# Patient Record
Sex: Female | Born: 1970
Health system: Southern US, Community
[De-identification: ages and names within clinical notes are randomized; demographics above are authoritative.]

## PROBLEM LIST (undated history)

## (undated) DIAGNOSIS — F329 Major depressive disorder, single episode, unspecified: Secondary | ICD-10-CM

## (undated) DIAGNOSIS — E079 Disorder of thyroid, unspecified: Secondary | ICD-10-CM

## (undated) DIAGNOSIS — F419 Anxiety disorder, unspecified: Secondary | ICD-10-CM

## (undated) DIAGNOSIS — F32A Depression, unspecified: Secondary | ICD-10-CM

## (undated) DIAGNOSIS — K219 Gastro-esophageal reflux disease without esophagitis: Secondary | ICD-10-CM

## (undated) HISTORY — DX: Anxiety disorder, unspecified: F41.9

## (undated) HISTORY — DX: Disorder of thyroid, unspecified: E07.9

## (undated) HISTORY — PX: CHOLECYSTECTOMY: SHX55

## (undated) HISTORY — DX: Depression, unspecified: F32.A

## (undated) HISTORY — DX: Major depressive disorder, single episode, unspecified: F32.9

## (undated) HISTORY — DX: Gastro-esophageal reflux disease without esophagitis: K21.9

---

## 2001-04-28 ENCOUNTER — Encounter: Payer: Self-pay | Admitting: Family Medicine

## 2001-04-28 ENCOUNTER — Encounter: Admission: RE | Admit: 2001-04-28 | Discharge: 2001-04-28 | Payer: Self-pay | Admitting: Family Medicine

## 2003-01-21 ENCOUNTER — Encounter: Admission: RE | Admit: 2003-01-21 | Discharge: 2003-01-21 | Payer: Self-pay | Admitting: Emergency Medicine

## 2003-03-03 ENCOUNTER — Other Ambulatory Visit: Admission: RE | Admit: 2003-03-03 | Discharge: 2003-03-03 | Payer: Self-pay | Admitting: Obstetrics and Gynecology

## 2003-11-29 ENCOUNTER — Ambulatory Visit: Payer: Self-pay | Admitting: Sports Medicine

## 2004-06-12 ENCOUNTER — Other Ambulatory Visit: Admission: RE | Admit: 2004-06-12 | Discharge: 2004-06-12 | Payer: Self-pay | Admitting: Obstetrics and Gynecology

## 2004-08-17 ENCOUNTER — Emergency Department (HOSPITAL_COMMUNITY): Admission: EM | Admit: 2004-08-17 | Discharge: 2004-08-17 | Payer: Self-pay | Admitting: Emergency Medicine

## 2004-08-20 ENCOUNTER — Encounter: Admission: RE | Admit: 2004-08-20 | Discharge: 2004-08-20 | Payer: Self-pay | Admitting: Emergency Medicine

## 2006-08-19 ENCOUNTER — Other Ambulatory Visit: Admission: RE | Admit: 2006-08-19 | Discharge: 2006-08-19 | Payer: Self-pay | Admitting: Obstetrics and Gynecology

## 2007-03-06 ENCOUNTER — Other Ambulatory Visit: Admission: RE | Admit: 2007-03-06 | Discharge: 2007-03-06 | Payer: Self-pay | Admitting: Obstetrics and Gynecology

## 2007-07-16 ENCOUNTER — Encounter: Admission: RE | Admit: 2007-07-16 | Discharge: 2007-07-16 | Payer: Self-pay | Admitting: Emergency Medicine

## 2007-09-22 ENCOUNTER — Other Ambulatory Visit: Admission: RE | Admit: 2007-09-22 | Discharge: 2007-09-22 | Payer: Self-pay | Admitting: Obstetrics and Gynecology

## 2008-03-21 ENCOUNTER — Other Ambulatory Visit: Admission: RE | Admit: 2008-03-21 | Discharge: 2008-03-21 | Payer: Self-pay | Admitting: Obstetrics and Gynecology

## 2009-03-11 ENCOUNTER — Emergency Department (HOSPITAL_COMMUNITY): Admission: EM | Admit: 2009-03-11 | Discharge: 2009-03-11 | Payer: Self-pay | Admitting: Emergency Medicine

## 2009-05-09 ENCOUNTER — Other Ambulatory Visit: Admission: RE | Admit: 2009-05-09 | Discharge: 2009-05-09 | Payer: Self-pay | Admitting: Obstetrics and Gynecology

## 2009-05-10 ENCOUNTER — Encounter: Admission: RE | Admit: 2009-05-10 | Discharge: 2009-05-10 | Payer: Self-pay | Admitting: Obstetrics and Gynecology

## 2010-03-04 ENCOUNTER — Encounter: Payer: Self-pay | Admitting: Emergency Medicine

## 2010-04-29 LAB — POCT I-STAT, CHEM 8
Calcium, Ion: 1.13 mmol/L (ref 1.12–1.32)
Chloride: 105 mEq/L (ref 96–112)
HCT: 43 % (ref 36.0–46.0)
Hemoglobin: 14.6 g/dL (ref 12.0–15.0)
Potassium: 4.4 mEq/L (ref 3.5–5.1)
TCO2: 29 mmol/L (ref 0–100)

## 2010-06-19 ENCOUNTER — Other Ambulatory Visit: Payer: Self-pay | Admitting: Obstetrics and Gynecology

## 2010-06-19 DIAGNOSIS — Z1231 Encounter for screening mammogram for malignant neoplasm of breast: Secondary | ICD-10-CM

## 2010-06-25 ENCOUNTER — Ambulatory Visit
Admission: RE | Admit: 2010-06-25 | Discharge: 2010-06-25 | Disposition: A | Discharge status: Home / Self Care | Payer: BC Managed Care – PPO | Source: Ambulatory Visit | Attending: Obstetrics and Gynecology | Admitting: Obstetrics and Gynecology

## 2010-06-25 DIAGNOSIS — Z1231 Encounter for screening mammogram for malignant neoplasm of breast: Secondary | ICD-10-CM

## 2010-07-18 ENCOUNTER — Other Ambulatory Visit: Payer: Self-pay | Admitting: Emergency Medicine

## 2010-07-18 DIAGNOSIS — E041 Nontoxic single thyroid nodule: Secondary | ICD-10-CM

## 2010-07-20 ENCOUNTER — Ambulatory Visit
Admission: RE | Admit: 2010-07-20 | Discharge: 2010-07-20 | Disposition: A | Discharge status: Home / Self Care | Payer: BC Managed Care – PPO | Source: Ambulatory Visit | Attending: Emergency Medicine | Admitting: Emergency Medicine

## 2010-07-20 DIAGNOSIS — E041 Nontoxic single thyroid nodule: Secondary | ICD-10-CM

## 2010-10-17 ENCOUNTER — Other Ambulatory Visit: Payer: Self-pay | Admitting: Obstetrics and Gynecology

## 2010-10-17 ENCOUNTER — Other Ambulatory Visit (HOSPITAL_COMMUNITY)
Admission: RE | Admit: 2010-10-17 | Discharge: 2010-10-17 | Disposition: A | Discharge status: Home / Self Care | Payer: BC Managed Care – PPO | Source: Ambulatory Visit | Attending: Obstetrics and Gynecology | Admitting: Obstetrics and Gynecology

## 2010-10-17 DIAGNOSIS — Z01419 Encounter for gynecological examination (general) (routine) without abnormal findings: Secondary | ICD-10-CM | POA: Insufficient documentation

## 2011-05-24 ENCOUNTER — Ambulatory Visit (INDEPENDENT_AMBULATORY_CARE_PROVIDER_SITE_OTHER): Payer: BC Managed Care – PPO | Admitting: Family Medicine

## 2011-05-24 VITALS — BP 124/82 | HR 76 | Temp 98.1°F | Resp 16 | Ht 66.5 in | Wt 152.6 lb

## 2011-05-24 DIAGNOSIS — R002 Palpitations: Secondary | ICD-10-CM

## 2011-05-24 NOTE — Progress Notes (Signed)
41 yo woman with "palpitations" which began in September, but the symptoms eased and now for two months the symptoms are worse.  They typically are worse when lying quietly, and her pulse skips beats.  No F/H of irregular heart beats.   1 drink of coffee.  Some coughing with pollen and home renovation.  PMHx:  GERD, hypothyroidism (years), and mild depression   O:  NAD Neck:  No adenopathy, palpable smooth thyroid Chest:  Clear Heart:  Regular, no murmur or gallop in three different positions Ext:  No edema, good pulses  EKG:  NSR  A:  Palpitations with some perimenopausal sx, mother went through menopause in early 49=0's  P:  Check thyroid, cmet, fsh

## 2011-05-24 NOTE — Patient Instructions (Signed)
Palpitations  A palpitation is the feeling that your heartbeat is irregular or is faster than normal. Although this is frightening, it usually is not serious. Palpitations may be caused by excesses of smoking, caffeine, or alcohol. They are also brought on by stress and anxiety. Sometimes, they are caused by heart disease. Unless otherwise noted, your caregiver did not find any signs of serious illness at this time. HOME CARE INSTRUCTIONS  To help prevent palpitations:  Drink decaffeinated coffee, tea, and soda pop. Avoid chocolate.   If you smoke or drink alcohol, quit or cut down as much as possible.   Reduce your stress or anxiety level. Biofeedback, yoga, or meditation will help you relax. Physical activity such as swimming, jogging, or walking also may be helpful.  SEEK MEDICAL CARE IF:   You continue to have a fast heartbeat.   Your palpitations occur more often.  SEEK IMMEDIATE MEDICAL CARE IF: You develop chest pain, shortness of breath, severe headache, dizziness, or fainting. Document Released: 01/26/2000 Document Revised: 01/17/2011 Document Reviewed: 03/27/2007 [employer] Patient Information 2012 [employer]. 

## 2011-05-25 LAB — TSH: TSH: 3.034 u[IU]/mL (ref 0.350–4.500)

## 2011-05-25 LAB — COMPREHENSIVE METABOLIC PANEL
ALT: 11 U/L (ref 0–35)
AST: 14 U/L (ref 0–37)
Albumin: 4.5 g/dL (ref 3.5–5.2)
Alkaline Phosphatase: 54 U/L (ref 39–117)
BUN: 14 mg/dL (ref 6–23)
CO2: 26 mEq/L (ref 19–32)
Calcium: 9.2 mg/dL (ref 8.4–10.5)
Chloride: 105 mEq/L (ref 96–112)
Creat: 0.76 mg/dL (ref 0.50–1.10)
Glucose, Bld: 88 mg/dL (ref 70–99)
Potassium: 3.8 mEq/L (ref 3.5–5.3)
Sodium: 139 mEq/L (ref 135–145)
Total Bilirubin: 0.3 mg/dL (ref 0.3–1.2)
Total Protein: 7.2 g/dL (ref 6.0–8.3)

## 2011-05-25 LAB — FOLLICLE STIMULATING HORMONE: FSH: 4.6 m[IU]/mL

## 2011-05-25 LAB — T4, FREE: Free T4: 1.3 ng/dL (ref 0.80–1.80)

## 2011-06-11 NOTE — Progress Notes (Signed)
Spoke with patient and let her know that everything was normal.

## 2011-07-28 ENCOUNTER — Other Ambulatory Visit: Payer: Self-pay | Admitting: *Deleted

## 2011-07-28 MED ORDER — LEVOTHYROXINE SODIUM 137 MCG PO TABS: 137.0000 ug | ORAL_TABLET | Freq: Every day | ORAL | Status: DC

## 2011-08-06 ENCOUNTER — Other Ambulatory Visit: Payer: Self-pay | Admitting: Emergency Medicine

## 2011-08-06 DIAGNOSIS — Z1231 Encounter for screening mammogram for malignant neoplasm of breast: Secondary | ICD-10-CM

## 2011-08-07 ENCOUNTER — Ambulatory Visit (INDEPENDENT_AMBULATORY_CARE_PROVIDER_SITE_OTHER): Payer: BC Managed Care – PPO | Admitting: Emergency Medicine

## 2011-08-07 VITALS — BP 130/85 | HR 71 | Temp 98.2°F | Resp 16 | Ht 67.0 in | Wt 148.0 lb

## 2011-08-07 DIAGNOSIS — R21 Rash and other nonspecific skin eruption: Secondary | ICD-10-CM

## 2011-08-07 LAB — POCT CBC
Granulocyte percent: 59.9 %G (ref 37–80)
HCT, POC: 44.5 % (ref 37.7–47.9)
Hemoglobin: 14.5 g/dL (ref 12.2–16.2)
Lymph, poc: 2.3 (ref 0.6–3.4)
MID (cbc): 0.4 (ref 0–0.9)
MPV: 12.7 fL (ref 0–99.8)
POC Granulocyte: 4.1 (ref 2–6.9)
POC LYMPH PERCENT: 34.5 %L (ref 10–50)
POC MID %: 5.6 %M (ref 0–12)
RBC: 4.94 M/uL (ref 4.04–5.48)
WBC: 6.8 10*3/uL (ref 4.6–10.2)

## 2011-08-07 LAB — POCT SEDIMENTATION RATE: POCT SED RATE: 8 mm/h (ref 0–22)

## 2011-08-07 MED ORDER — LEVOTHYROXINE SODIUM 137 MCG PO TABS: 137.0000 ug | ORAL_TABLET | Freq: Every day | ORAL | Status: DC

## 2011-08-07 MED ORDER — SERTRALINE HCL 25 MG PO TABS: 25.0000 mg | ORAL_TABLET | Freq: Every day | ORAL | Status: DC

## 2011-08-07 MED ORDER — PERMETHRIN 5 % EX CREA: TOPICAL_CREAM | CUTANEOUS | Status: DC

## 2011-08-07 NOTE — Progress Notes (Signed)
  Subjective:    Patient ID: Kimberly Mooney, female    DOB: 1970-11-17, 41 y.o.   MRN: [id number]  HPI patient enters after noticing a rash the last 24-48 hours primarily on her abdomen but also in place on the back of her left hand. She does not know of any tick exposures. She has not had a fever. She overall feels well. The areas do not itch. She does have a history of a borderline positive ANA in the past . Recent repeat testing was negative    Review of Systems     Objective:   Physical Exam there are multiple 2 x 3 mm nonblanching lesions across the abdomen and a few on the lower back there no areas on the legs. There is one red area on the back of the finger on her left hand. Her HEENT exam is unremarkable chest clear heart regular rate no murmur  Results for orders placed in visit on 08/07/11  POCT CBC      Component Value Range   WBC 6.8  4.6 - 10.2 K/uL   Lymph, poc 2.3  0.6 - 3.4   POC LYMPH PERCENT 34.5  10 - 50 %L   MID (cbc) 0.4  0 - 0.9   POC MID % 5.6  0 - 12 %M   POC Granulocyte 4.1  2 - 6.9   Granulocyte percent 59.9  37 - 80 %G   RBC 4.94  4.04 - 5.48 M/uL   Hemoglobin 14.5  12.2 - 16.2 g/dL   HCT, POC 28.4  13.2 - 47.9 %   MCV 90.1  80 - 97 fL   MCH, POC 29.4  27 - 31.2 pg   MCHC 32.6  31.8 - 35.4 g/dL   RDW, POC 44.0     Platelet Count, POC 192  142 - 424 K/uL   MPV 12.7  0 - 99.8 fL  POCT SEDIMENTATION RATE      Component Value Range   POCT SED RATE 8  0 - 22 mm/hr        Assessment & Plan:  These areas could be scabies but they do not itch at all. With a normal white count and sedimentation rate of 80 that goes against any type of tic disease especially with the patient not having fever or feeling ill. 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

## 2011-08-07 NOTE — Patient Instructions (Signed)
Vasculitis Vasculitis is when your blood vessels are inflamed. There are many different blood vessels in the body, and vasculitis can affect any of them. This includes large (veins and arteries) and small (capillaries) vessels. With vasculitis,   Blood vessel walls can become thick.   Blood vessels can become narrow.   Blood vessels can become weak. Sometimes, it becomes so weak that the blood vessel bulges out like a balloon. This is called an aneurysm. Aneurysms are rare but can be life-threatening.   Scarring can occur.   Not enough blood can flow through the blood vessels.  All of these things can damage many parts of the body, including the muscles, kidneys, lungs and brain. There are many types of vasculitis. Some types are short-term (acute), while others are long-term (chronic). Some types may go away without treatment, and others may need to be treated for a long time.  CAUSES  Vasculitis occurs when the body's immune system (which fights germs and disease) makes a mistake. It attacks its own blood vessels. This causes inflammation (the body's way of reacting to injury or infection).   Why this happens is usually not known. The condition is then called primary vasculitis.   Sometimes, something triggers the inflammation. This is called secondary vasculitis. Possible causes include:   Infections.   An immune system disease. Examples include lupus, rheumatoid arthritis and scleroderma.   An allergic reaction to a medicine.   Cancer that affects blood cells. This includes leukemia and lymphoma.   Males and females of all ages and races can develop vasculitis. Some risk factors make vasculitis more likely. These include:   Smoking.   Stress.   Physical injury.  SYMPTOMS  There are more than 20 types of vasculitis. Symptoms of each type vary, but some symptoms are common.  Many people with vasculitis:   Have a fever.   Do not feel like eating.   Lose weight.   Feel  very tired.   Have aches and pains.   Feel weak.   Start to not have feeling (numbness) in an area.   Symptoms for some types of vasculitis also could be:   Sores in the mouth or eyes.   Skin problems. This could be sores, spots or rashes.   Trouble seeing.   Trouble breathing.   Blood in the urine.   Headaches.   Pain in the abdomen.   Stuffy or bloody nose.  DIAGNOSIS  Vasculitis symptoms are similar to symptoms of many other conditions. That can make it hard to tell if you have vasculitis. To be sure, your caregiver will ask about your symptoms and do a physical exam. Certain tests may be necessary, such as:   A complete blood count (CBC). This test shows how many red blood cells are in your blood. Not having enough red blood cells (anemic) can result from vasculitis.   Erythrocyte sedimentation (also called sed rate test). It measures inflammation in the body.   C reactive protein (CRP). This also shows if there is inflammation.   Anti-neutrophil cytoplasmic antibodies (ANCA). This can tell if the immune system is reacting to certain cells in the blood.   A urine test. This checks for blood or protein in the urine. That could be a sign of kidney damage from vasculitis.   Imaging tests. These tests create pictures from inside the body. Options include:   X-rays.   Computed tomography (CT) scan. This uses X-rays guided by a computer.   Ultrasound. It   creates an image using sound waves.   Magnetic resonance imaging (MRI). It uses radio waves, magnets and a computer.   Angiography. A dye is put into your blood vessels. Then, an X-ray is taken of them.   A biopsy of a blood vessel. This means your caregiver will take out a small piece of a blood vessel. Then, it is checked under a microscope. This is an important test. It often is the best way to know for sure if you have vasculitis.  TREATMENT   Treatment will depend on the type of vasculitis and how severe it is.  Often, you will need to see a specialist in immunologic diseases (rheumatologist).   Some types of vasculitis may go away without treatment.   Some types need only over-the-counter drugs.   Prescription medicines are used to treat many types of vasculitis. For example:   Corticosteroids. These are the drugs used most often. They are very powerful. Usually, a high dose is taken until symptoms improve. Then, the dose is gradually decreased. Using corticosteroids for a long time can cause problems. They can make muscles and bones weak. They can cause blood pressure to go up, and cause diabetes. Also, people often gain weight when they take corticosteroids.   Cytotoxic drugs. These kill cells that cause inflammation. Sometimes, they are used if corticosteroids do not help. Other times, both medications are taken.   Surgery. This may be needed to repair a blood vessel that has bulged out (aneurysm).   Treatment can sometimes cure your disease. Other times, it can put the disease in remission (no symptoms). Increased treatment and reevaluation might be necessary if your disease comes back or flares.  HOME CARE INSTRUCTIONS   Take any medications that your caregiver prescribes. Follow the directions carefully.   Watch for any problems that can be caused by a drug (side effects). Tell your caregiver right away if you notice any changes or problems.   Keep all appointments for checkups. This is important to help your caregiver watch for side effects. Checkups may include:   Periodic blood tests.   Bone density testing. This checks how strong or weak your bones are.   Blood pressure checks. If your blood pressure rises, you may need to take a drug to control it while you are taking corticosteroids.   Blood sugar checks. This is to be sure you are not developing diabetes. If you have diabetes, corticosteroid medications may make it worse and require increased treatment.   Exercise. First, talk  with your caregiver about what would be OK for you to do. Aerobic exercise (which increases your heart rate) is often suggested. It includes walking. This type of exercise is good because it helps prevent bone loss. It also helps control your blood pressure.   Follow a healthy diet. Include good sources of protein in your diet. Also include fruits, vegetables and whole grains. Your caregiver can refer you to an expert on healthy eating (dietitian) for more detailed advice.   Learn as much as you can about vasculitis. Understanding your condition can help you cope with it. Coping can be hard because this may be something you will have to live with for years.   Consider joining a support group. It often helps to talk about your worries with others who have the same problems.   Tell your caregiver if you feel stressed, anxious or depressed. Your caregiver may refer you to a specialist, or recommend medication to relieve your symptoms.    SEEK MEDICAL CARE IF:   The symptoms that led to your diagnosis return.   You develop worsening fever, fatigue, headache, weight loss or pain in your jaw.   You develop signs of infection. Infections can be worse if you are on corticosteroid medication.   You develop any new or unexplained symptoms of disease.  SEEK IMMEDIATE MEDICAL CARE IF:   Your eyesight changes.   Pain does not go away, even after taking medication.   You feel pain in your chest or abdomen.   You have trouble breathing.   One side of your face or body becomes suddenly weak or numb.   Your nose bleeds.   There is blood in your urine.   You develop a fever of more than 102 F (38.9 C).  Document Released: 11/24/2008 Document Revised: 01/17/2011 Document Reviewed: 11/24/2008 [facility] Patient Information 2012 [place], [place] Spotted Fever [place] Spotted Fever (RMSF) is the oldest known tick-borne disease of people in the [place]. This disease was named  because it was first described among people in the [facility] area who had an illness characterized by a rash with red-purple-black spots. This disease is caused by a rickettsia (Rickettsia rickettsii), a bacteria carried by the tick. The [facility] wood tick and the American dog tick, acquire and transmit the RMSF bacteria (pictures NOT actual size). When a larval, nymphal or adult tick feeds on an infected rodent or larger animal, the tick can become infected. Infected adult ticks then feed on people who may then get RMSF. The tick transmits the disease to humans during a prolonged period of feeding that lasts many hours, days or even a couple weeks. The bite is painless and frequently goes unnoticed. An infected female tick may also pass the rickettsial bacteria to her eggs that then may mature to be infected adult ticks. The rickettsia that causes RMSF can also get into a person's body through damaged skin. A tick bite is not necessary. People can get RMSF if they crush a tick and get it's blood or body fluids on their skin through a small cut or sore.  DIAGNOSIS Diagnosis is made by laboratory tests.  TREATMENT Treatment is with antibiotics (medications that kill rickettsia and other bacteria). Immediate treatment usually prevents death. GEOGRAPHIC RANGE This disease was reported only in the [facility] until 1931. RMSF has more recently been described among individuals in all states except [place], [employer] and [place]. The highest reported incidences of RMSF now occur among residents of [place], [place], [place] and [address]. TIME OF YEAR  Most cases are diagnosed during late spring and summer when ticks are most active. However, especially in the warmer [place] states, a few cases occur during the winter. SYMPTOMS   Symptoms of RMSF begin from 2 to 14 days after a tick bite. The most common early symptoms are fever, muscle aches and headache followed by nausea (feeling sick to  your stomach) or vomiting.   The RMSF rash is typically delayed until 3 or more days after symptom onset, and eventually develops in 9 of 10 infected patients by the 5th day of illness.  If the disease is not treated it can cause death. If you get a fever, headache, muscle aches, rash, nausea or vomiting within 2 weeks of a possible tick bite or exposure you should see your caregiver immediately. PREVENTION Ticks prefer to hide in shady, moist ground litter. They can often be found above the ground clinging to tall grass, brush,  shrubs and low tree branches. They also inhabit lawns and gardens, especially at the edges of woodlands and around old stone walls. Within the areas where ticks generally live, no naturally vegetated area can be considered completely free of infected ticks. The best precaution against RMSF is to avoid contact with soil, leaf litter and vegetation as much as possible in tick infested areas. For those who enjoy gardening or walking in their yards, clear brush and mow tall grass around houses and at the edges of gardens. This may help reduce the tick population in the immediate area. Applications of chemical insecticides by a licensed professional in the spring (late May) and Fall (September) will also control ticks, especially in heavily infested areas. Treatment will never get rid of all the ticks. Getting rid of small animal populations that host ticks will also decrease the tick population. When working in the garden, [employer], or handling soil and vegetation, wear light-colored protective clothing and gloves. Spot-check often to prevent ticks from reaching the skin. Ticks cannot jump or fly. They will not drop from an above-ground perch onto a passing animal. Once a tick gains access to human skin it climbs upward until it reaches a more protected area. For example, the back of the knee, groin, navel, armpit, ears or nape of the neck. It then begins the slow process of embedding  itself in the skin. Campers, hikers, field workers, and others who spend time in wooded, brushy or tall grassy areas can avoid exposure to ticks by using the following precautions:  Wear light-colored clothing with a tight weave to spot ticks more easily and prevent contact with the skin.   Wear long pants tucked into socks, long-sleeved shirts tucked into pants and enclosed shoes or boots along with insect repellent.   Spray clothes with insect repellent containing either DEET or Permethrin. Only DEET can be used on exposed skin. Follow the manufacturer's directions carefully.   Wear a hat and keep long hair pulled back.   Stay on cleared, well-worn trails whenever possible.   Spot-check yourself and others often for the presence of ticks on clothes. If you find one, there are likely to be others. Check thoroughly.   Remove clothes after leaving tick-infested areas. If possible, wash them to eliminate any unseen ticks. Check yourself, your children and any pets from head to toe for the presence of ticks.   Shower and shampoo.  You can greatly reduce your chances of contracting RMSF if you remove attached ticks as soon as possible. Regular checks of the body, including all body sites covered by hair (head, armpits, genitals), allow removal of the tick before rickettsial transmission. To remove an attached tick, use a forceps or tweezers to detach the intact tick without leaving mouth parts in the skin. The tick bite wound should be cleansed after tick removal. Remember the most common symptoms of RMSF are fever, muscle aches, headache and nausea or vomiting with a later onset of rash. If you get these symptoms after a tick bite and while living in an area where RMSF is found, RMSF should be suspected. If the disease is not treated, it can cause death. See your caregiver immediately if you get these symptoms. Do this even if not aware of a tick bite. Document Released: 05/12/2000 Document Revised:  01/17/2011 Document Reviewed: 01/02/2009 [facility] Patient Information 2012 [place], [place].

## 2011-08-08 ENCOUNTER — Ambulatory Visit: Payer: BC Managed Care – PPO

## 2011-08-08 LAB — B. BURGDORFI ANTIBODIES: B burgdorferi Ab IgG+IgM: 0.78 {ISR}

## 2011-08-08 LAB — ROCKY MTN SPOTTED FVR AB, IGM-BLOOD: ROCKY MTN SPOTTED FEVER, IGM: 0.4 IV

## 2011-08-13 ENCOUNTER — Telehealth: Payer: Self-pay

## 2011-08-13 ENCOUNTER — Other Ambulatory Visit: Payer: Self-pay | Admitting: Emergency Medicine

## 2011-08-13 NOTE — Telephone Encounter (Signed)
Pt was seen on 06/26 and was given Elimite cream.  She used it and was itching by the end of the weekend. She would rather have a round of prednisone if she can.  If not can she get some more cream?

## 2011-08-13 NOTE — Telephone Encounter (Signed)
PT REQUESTING PREDNISONE RX.   BEST PHONE  [phone]   PHARMACY [facility]

## 2011-08-13 NOTE — Telephone Encounter (Signed)
lmom to cb.  Make sure this is correct med.  If so then pt needs to be seen.

## 2011-08-15 ENCOUNTER — Telehealth: Payer: Self-pay

## 2011-08-15 NOTE — Telephone Encounter (Signed)
Patient was seen recently by Dr Cleta Alberts for a rash. Called 2 days ago about getting prednisone called in and has not received call back. Still has the rash and still itching. Uses [facility]. Best call back # [phone].

## 2011-08-15 NOTE — Telephone Encounter (Signed)
Pt stated when she called that she had called a couple of days ago, but we do not see any record of the call. Pt asked operator when she called about getting prednisone for the itching that she is having. Dr Cleta Alberts, do you want to Rx?

## 2011-08-15 NOTE — Telephone Encounter (Signed)
OK'd call-in prednisone Dosepak 10 mg 6 by mouth for one day 5 by mouth for one day for by mouth for one day 3 by mouth for one day 2 by mouth for one day one a day for one day.This is prednisone 10 mg.

## 2011-08-15 NOTE — Telephone Encounter (Signed)
Called in Rx for pred to [facility] and notified pt and explained how to take the tapered doses.

## 2011-08-15 NOTE — Telephone Encounter (Signed)
Refill already addressed.

## 2011-08-23 ENCOUNTER — Other Ambulatory Visit: Payer: Self-pay

## 2011-08-23 MED ORDER — LEVOTHYROXINE SODIUM 137 MCG PO TABS: 137.0000 ug | ORAL_TABLET | Freq: Every day | ORAL | Status: DC

## 2011-09-01 ENCOUNTER — Ambulatory Visit (INDEPENDENT_AMBULATORY_CARE_PROVIDER_SITE_OTHER): Payer: BC Managed Care – PPO | Admitting: Family Medicine

## 2011-09-01 VITALS — BP 129/78 | HR 74 | Temp 98.0°F | Resp 16 | Ht 66.5 in | Wt 150.0 lb

## 2011-09-01 DIAGNOSIS — R21 Rash and other nonspecific skin eruption: Secondary | ICD-10-CM

## 2011-09-01 MED ORDER — HYDROXYZINE HCL 10 MG PO TABS: 10.0000 mg | ORAL_TABLET | Freq: Three times a day (TID) | ORAL | Status: AC | PRN

## 2011-09-01 MED ORDER — IVERMECTIN 3 MG PO TABS: ORAL_TABLET | ORAL | Status: DC

## 2011-09-01 NOTE — Patient Instructions (Signed)
Results for orders placed in visit on 08/07/11  POCT CBC      Component Value Range   WBC 6.8  4.6 - 10.2 K/uL   Lymph, poc 2.3  0.6 - 3.4   POC LYMPH PERCENT 34.5  10 - 50 %L   MID (cbc) 0.4  0 - 0.9   POC MID % 5.6  0 - 12 %M   POC Granulocyte 4.1  2 - 6.9   Granulocyte percent 59.9  37 - 80 %G   RBC 4.94  4.04 - 5.48 M/uL   Hemoglobin 14.5  12.2 - 16.2 g/dL   HCT, POC 01.0  93.2 - 47.9 %   MCV 90.1  80 - 97 fL   MCH, POC 29.4  27 - 31.2 pg   MCHC 32.6  31.8 - 35.4 g/dL   RDW, POC 35.5     Platelet Count, POC 192  142 - 424 K/uL   MPV 12.7  0 - 99.8 fL  ROCKY MTN SPOTTED FVR AB, IGM-BLOOD      Component Value Range   ROCKY MTN SPOTTED FEVER, IGM 0.40    B. BURGDORFI ANTIBODIES      Component Value Range   B burgdorferi Ab IgG+IgM 0.78    POCT SEDIMENTATION RATE      Component Value Range   POCT SED RATE 8  0 - 22 mm/hr

## 2011-09-01 NOTE — Progress Notes (Signed)
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  She was initially seen by Dr. Cleta Alberts who felt patient's rash was most consistent with scabies and she was treated with permethrin and prednisone.  The rash slowly resolved over three weeks but has returned over the last few days.  The rash has affected both axillae, breasts, abdomen, and groin areas including proximal thighs.  Her family has no rashes.  The rash spares her hands and forearms.  One papule on her right thigh has a small, painful ulceration.   O:  NAD Rash:  Scattered intensely red papules axillae, abdomen, groin, lower abdomen and proximal medial thighs.  They are uniform in size and evenly distributed about 2 cm apart.  The right medial thigh lesion is 2 mm and ulcerated   Results for orders placed in visit on 08/07/11  POCT CBC      Component Value Range   WBC 6.8  4.6 - 10.2 K/uL   Lymph, poc 2.3  0.6 - 3.4   POC LYMPH PERCENT 34.5  10 - 50 %L   MID (cbc) 0.4  0 - 0.9   POC MID % 5.6  0 - 12 %M   POC Granulocyte 4.1  2 - 6.9   Granulocyte percent 59.9  37 - 80 %G   RBC 4.94  4.04 - 5.48 M/uL   Hemoglobin 14.5  12.2 - 16.2 g/dL   HCT, POC 40.9  81.1 - 47.9 %   MCV 90.1  80 - 97 fL   MCH, POC 29.4  27 - 31.2 pg   MCHC 32.6  31.8 - 35.4 g/dL   RDW, POC 91.4     Platelet Count, POC 192  142 - 424 K/uL   MPV 12.7  0 - 99.8 fL  ROCKY MTN SPOTTED FVR AB, IGM-BLOOD      Component Value Range   ROCKY MTN SPOTTED FEVER, IGM 0.40    B. BURGDORFI ANTIBODIES      Component Value Range   B burgdorferi Ab IgG+IgM 0.78    POCT SEDIMENTATION RATE      Component Value Range   POCT SED RATE 8  0 - 22 mm/hr   A:  Atypical rash, pruritic but with odd distribution and ulceration.  The sparing of the fingers and forearms makes scabies much less likely.  P:   1. Rash  ivermectin (STROMECTOL) 3 MG TABS, hydrOXYzine (ATARAX/VISTARIL) 10 MG tablet, Ambulatory referral to Dermatology

## 2011-09-05 ENCOUNTER — Other Ambulatory Visit: Payer: Self-pay

## 2011-10-31 ENCOUNTER — Ambulatory Visit: Payer: BC Managed Care – PPO

## 2011-11-04 ENCOUNTER — Ambulatory Visit
Admission: RE | Admit: 2011-11-04 | Discharge: 2011-11-04 | Disposition: A | Discharge status: Home / Self Care | Payer: BC Managed Care – PPO | Source: Ambulatory Visit | Attending: Emergency Medicine | Admitting: Emergency Medicine

## 2011-11-04 DIAGNOSIS — Z1231 Encounter for screening mammogram for malignant neoplasm of breast: Secondary | ICD-10-CM

## 2011-12-10 ENCOUNTER — Ambulatory Visit: Payer: BC Managed Care – PPO

## 2011-12-10 ENCOUNTER — Encounter: Payer: Self-pay | Admitting: Emergency Medicine

## 2011-12-10 ENCOUNTER — Ambulatory Visit (INDEPENDENT_AMBULATORY_CARE_PROVIDER_SITE_OTHER): Payer: BC Managed Care – PPO | Admitting: Emergency Medicine

## 2011-12-10 ENCOUNTER — Other Ambulatory Visit: Payer: Self-pay | Admitting: Emergency Medicine

## 2011-12-10 VITALS — BP 130/82 | HR 73 | Temp 98.2°F | Resp 16 | Ht 66.5 in | Wt 148.6 lb

## 2011-12-10 DIAGNOSIS — M25559 Pain in unspecified hip: Secondary | ICD-10-CM

## 2011-12-10 DIAGNOSIS — R1084 Generalized abdominal pain: Secondary | ICD-10-CM

## 2011-12-10 DIAGNOSIS — Z139 Encounter for screening, unspecified: Secondary | ICD-10-CM

## 2011-12-10 DIAGNOSIS — Z202 Contact with and (suspected) exposure to infections with a predominantly sexual mode of transmission: Secondary | ICD-10-CM

## 2011-12-10 DIAGNOSIS — L410 Pityriasis lichenoides et varioliformis acuta: Secondary | ICD-10-CM | POA: Insufficient documentation

## 2011-12-10 DIAGNOSIS — Z23 Encounter for immunization: Secondary | ICD-10-CM

## 2011-12-10 LAB — COMPREHENSIVE METABOLIC PANEL
ALT: 11 U/L (ref 0–35)
CO2: 26 mEq/L (ref 19–32)
Calcium: 9.6 mg/dL (ref 8.4–10.5)
Chloride: 102 mEq/L (ref 96–112)
Sodium: 135 mEq/L (ref 135–145)
Total Protein: 7.1 g/dL (ref 6.0–8.3)

## 2011-12-10 LAB — CBC WITH DIFFERENTIAL/PLATELET
HCT: 41 % (ref 36.0–46.0)
Hemoglobin: 14.1 g/dL (ref 12.0–15.0)
Lymphocytes Relative: 27 % (ref 12–46)
Lymphs Abs: 1.9 10*3/uL (ref 0.7–4.0)
MCHC: 34.4 g/dL (ref 30.0–36.0)
Monocytes Absolute: 0.5 10*3/uL (ref 0.1–1.0)
Monocytes Relative: 7 % (ref 3–12)
Neutro Abs: 4.6 10*3/uL (ref 1.7–7.7)
RBC: 4.79 MIL/uL (ref 3.87–5.11)
WBC: 7.1 10*3/uL (ref 4.0–10.5)

## 2011-12-10 LAB — POCT SEDIMENTATION RATE: POCT SED RATE: 7 mm/hr (ref 0–22)

## 2011-12-10 LAB — HIV ANTIBODY (ROUTINE TESTING W REFLEX): HIV: NONREACTIVE

## 2011-12-10 NOTE — Patient Instructions (Signed)
Please make an appointment to see her dermatologist to discuss the issue here having with itching

## 2011-12-10 NOTE — Progress Notes (Signed)
  Subjective:    Patient ID: Kimberly Mooney, female    DOB: August 08, 1970, 41 y.o.   MRN: [id number]  HPI Kimberly Mooney comes in with a chief complaint today of itching. She was diagnosed withPleva by biopsy but only took a short course of steroids. She feels a crawling sensation in her skin. She has tried Zyrtec and try Claritin without any improvement. She does take some supplements but has tried stopping these intermittently but continues to have the itching. She also has had some postprandial back and abdominal discomfort and sheets a fatty meal. She is concerned about gallbladder disease. 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    Review of Systems     Objective:   Physical Exam patient is alert cooperative in no distress. Her neck is supple there is a birthmark on the right side of her neck. Her chest is clear to auscultation and percussion. Her cardiac exam reveals a regular rate without murmurs rubs or gallops. The abdomen is soft liver and spleen not enlarged. There is no axillary or inguinal or cervical adenopathy noted.  [facility] reading (PRIMARY) by  Dr Cleta Alberts x-rays of the chest and both hips are norma      Assessment & Plan:     Give a trial of Allegra 1 a day.Maryclare Labrador schedule ultrasound of abdomen to rule out gallbladder disease. I recommended she make an appointment to see the dermatologist for their opinion. Appointment to be made with Dr. Hortense Ramal

## 2011-12-12 ENCOUNTER — Telehealth: Payer: Self-pay

## 2011-12-12 ENCOUNTER — Encounter: Payer: Self-pay | Admitting: *Deleted

## 2011-12-12 NOTE — Telephone Encounter (Signed)
Please call [facility] and add on Hep Screening Panel

## 2011-12-12 NOTE — Telephone Encounter (Signed)
Patient saw a specialist today and they wanted to know if we could add testing for Hep B, Hep C to the lab work that was done on Tuesday.  [phone]

## 2011-12-13 ENCOUNTER — Ambulatory Visit
Admission: RE | Admit: 2011-12-13 | Discharge: 2011-12-13 | Disposition: A | Discharge status: Home / Self Care | Payer: BC Managed Care – PPO | Source: Ambulatory Visit | Attending: Emergency Medicine | Admitting: Emergency Medicine

## 2011-12-13 DIAGNOSIS — R1084 Generalized abdominal pain: Secondary | ICD-10-CM

## 2011-12-13 LAB — HEPATITIS B SURFACE ANTIBODY, QUANTITATIVE: Hepatitis B-Post: 0 m[IU]/mL

## 2011-12-13 NOTE — Telephone Encounter (Signed)
Added Hep screening panel at [facility] spoke with Selena Batten.

## 2011-12-16 ENCOUNTER — Telehealth: Payer: Self-pay

## 2011-12-16 NOTE — Telephone Encounter (Signed)
The patient called to request that a lab tech return her call. The patient stated she is "playing phone tag" with our office.  Please call the patient at [phone].

## 2011-12-17 ENCOUNTER — Other Ambulatory Visit: Payer: Self-pay | Admitting: Emergency Medicine

## 2011-12-17 ENCOUNTER — Telehealth: Payer: Self-pay | Admitting: Radiology

## 2011-12-17 DIAGNOSIS — K8020 Calculus of gallbladder without cholecystitis without obstruction: Secondary | ICD-10-CM

## 2011-12-17 NOTE — Telephone Encounter (Signed)
LMOM that normal labs and xray were mailed to her on 12-12-11.

## 2011-12-17 NOTE — Telephone Encounter (Signed)
Went over results with pt and would like for [place] to set up referral for general surgeon.

## 2011-12-30 ENCOUNTER — Ambulatory Visit (INDEPENDENT_AMBULATORY_CARE_PROVIDER_SITE_OTHER): Payer: BC Managed Care – PPO | Admitting: Surgery

## 2011-12-30 ENCOUNTER — Encounter (INDEPENDENT_AMBULATORY_CARE_PROVIDER_SITE_OTHER): Payer: Self-pay | Admitting: Surgery

## 2011-12-30 VITALS — BP 112/76 | HR 68 | Temp 98.4°F | Resp 12 | Ht 67.0 in | Wt 147.0 lb

## 2011-12-30 DIAGNOSIS — K8010 Calculus of gallbladder with chronic cholecystitis without obstruction: Secondary | ICD-10-CM

## 2011-12-30 NOTE — Patient Instructions (Addendum)
See the Handout(s) we gave you.  Consider surgery.  Please call our office at [phone] if you wish to schedule surgery or if you have further questions / concerns.   Cholelithiasis Cholelithiasis (also called gallstones) is a form of gallbladder disease where gallstones form in your gallbladder. The gallbladder is a non-essential organ that stores bile made in the liver, which helps digest fats. Gallstones begin as small crystals and slowly grow into stones. Gallstone pain occurs when the gallbladder spasms, and a gallstone is blocking the duct. Pain can also occur when a stone passes out of the duct.  Women are more likely to develop gallstones than men. Other factors that increase the risk of gallbladder disease are:  Having multiple pregnancies. Physicians sometimes advise removing diseased gallbladders before future pregnancies.  Obesity.  Diets heavy in fried foods and fat.  Increasing age (older than 60).  Prolonged use of medications containing female hormones.  Diabetes mellitus.  Rapid weight loss.  Family history of gallstones (heredity). SYMPTOMS  Feeling sick to your stomach (nauseous).  Abdominal pain.  Yellowing of the skin (jaundice).  Sudden pain. It may persist from several minutes to several hours.  Worsening pain with deep breathing or when jarred.  Fever.  Tenderness to the touch. In some cases, when gallstones do not move into the bile duct, people have no pain or symptoms. These are called "silent" gallstones. TREATMENT In severe cases, emergency surgery may be required. HOME CARE INSTRUCTIONS   Only take over-the-counter or prescription medicines for pain, discomfort, or fever as directed by your caregiver.  Follow a low-fat diet until seen again. Fat causes the gallbladder to contract, which can result in pain.  Follow up as instructed. Attacks are almost always recurrent and surgery is usually required for permanent treatment. SEEK  IMMEDIATE MEDICAL CARE IF:   Your pain increases and is not controlled by medications.  You have an oral temperature above 102 F (38.9 C), not controlled by medication.  You develop nausea and vomiting. MAKE SURE YOU:   Understand these instructions.  Will watch your condition.  Will get help right away if you are not doing well or get worse. Document Released: 01/24/2005 Document Revised: 04/22/2011 Document Reviewed: 03/29/2010 Exit[employer] Patient Information 2013 [employer].  

## 2011-12-30 NOTE — Progress Notes (Signed)
Subjective:     Patient ID: Kimberly Mooney, female   DOB: 04/12/1970, 41 y.o.   MRN: [id number]  HPI  Kimberly Mooney  03/07/1970 [id number]  Patient Care Team: Collene Gobble, MD as PCP - General (Family Medicine)  This patient is a 41 y.o.female who presents today for surgical evaluation at the request of Dr. Cleta Alberts.   Reason for evaluation: Abdominal pain.  Probable biliary colic.  Pleasant active woman.  Works as a Engineer, civil (consulting) at [employer].  Has had three episodes of severe upper abdominal/chest pain.  Waking her up at night.  Two episodes were after eating [place] food.  Another one was after eating a funnel cake.  Felt uncomfortable and bloated.  No severe nausea or vomiting.  Radiated to her back.  No one else was sick.  She has a history of chronic reflux.  Followed by Dr. Bosie Clos.  Had endoscopy last year that was otherwise underwhelming.  Well controlled on Pepcid twice a day.  Normally has a bowel movement every other day.  Can walk several miles without difficulty.  No family history of bowel problems.  Occasionally has a glass of wine at night.  No difficulty with tolerating that.  No episodes of hepatitis nor pancreatitis.  No personal nor family history of GI/colon cancer, inflammatory bowel disease, irritable bowel syndrome, allergy such as Celiac Sprue, dietary/dairy problems, colitis, ulcers nor gastritis.  No recent sick contacts/gastroenteritis.  No travel outside the country.  No changes in diet.  She has had some episodes of itching.  No jaundice.  No dark-colored urine.  No light stools.  No change in eye/skin color.    Patient Active Problem List  Diagnosis  . PLEVA (pityriasis lichenoides et varioliformis acuta)  . Chronic cholecystitis with calculus    Past Medical History  Diagnosis Date  . GERD (gastroesophageal reflux disease)   . Thyroid disease     hypothyroidism  . Gallstones     History reviewed. No pertinent past surgical history.  History   Social  History  . Marital Status: Single    Spouse Name: N/A    Number of Children: N/A  . Years of Education: N/A   Occupational History  . Not on file.   Social History Main Topics  . Smoking status: Former Games developer  . Smokeless tobacco: Not on file     Comment: smoked occasionally in college  . Alcohol Use: Yes     Comment: social  . Drug Use: No  . Sexually Active: Not on file   Other Topics Concern  . Not on file   Social History Narrative  . No narrative on file    Family History  Problem Relation Age of Onset  . Cancer Mother     breast    Current Outpatient Prescriptions  Medication Sig Dispense Refill  . B Complex Vitamins (VITAMIN B COMPLEX PO) Take by mouth daily.      . famotidine (PEPCID) 20 MG tablet Take 20 mg by mouth 2 (two) times daily.      . fexofenadine (ALLEGRA) 60 MG tablet Take 60 mg by mouth 2 (two) times daily.      Marland Kitchen ibuprofen (ADVIL,MOTRIN) 200 MG tablet Take 200 mg by mouth every 6 (six) hours as needed.      Marland Kitchen levonorgestrel (MIRENA) 20 MCG/24HR IUD 1 each by Intrauterine route once.      Marland Kitchen levothyroxine (SYNTHROID, LEVOTHROID) 137 MCG tablet Take 1 tablet (137 mcg total) by mouth daily.  30 tablet  11  . Probiotic Product (PROBIOTIC PO) Take by mouth daily.      . sertraline (ZOLOFT) 25 MG tablet Take 1 tablet (25 mg total) by mouth daily.  90 tablet  3     Allergies  Allergen Reactions  . Amoxicillin Diarrhea  . Augmentin (Amoxicillin-Pot Clavulanate) Diarrhea  . Doxycycline   . Shellfish Allergy     BP 112/76  Pulse 68  Temp 98.4 F (36.9 C) (Temporal)  Resp 12  Ht 5\' 7"  (1.702 m)  Wt 147 lb (66.679 kg)  BMI 23.02 kg/m2  LMP 12/01/2011  Dg Chest 2 View  12/10/2011  *RADIOLOGY REPORT*  Clinical Data: Screening for lymphoma.  CHEST - 2 VIEW  Comparison: None  Findings: Midline trachea.  Normal heart size and mediastinal contours. No pleural effusion or pneumothorax.  Minimal biapical pleural parenchymal scarring. Clear lungs.   IMPRESSION: No acute process or evidence of lymphoma.  No evidence of adenopathy in the chest.   Original Report Authenticated By: Consuello Bossier, M.D.    Dg Hip Complete Left  12/10/2011  *RADIOLOGY REPORT*  Clinical Data: Hip pain. No trauma history submitted.  LEFT HIP - COMPLETE 2+ VIEW  Comparison: None.  Findings: AP and frog-leg views. No acute fracture or dislocation. Joint spaces maintained.  No evidence of avascular necrosis.  IMPRESSION: Normal left hip.   Original Report Authenticated By: Consuello Bossier, M.D.    Dg Hip Complete Right  12/10/2011  *RADIOLOGY REPORT*  Clinical Data: Hip pain. No trauma history submitted.  RIGHT HIP - COMPLETE 2+ VIEW  Comparison: None.  Findings: AP view of the pelvis and frog-leg view the right hip. Femoral heads are located.  Intrauterine device. Sacroiliac joints are symmetric.  No acute fracture.  Joint spaces maintained.  IMPRESSION: No acute osseous abnormality.   Original Report Authenticated By: Consuello Bossier, M.D.    US Abdomen Complete  12/13/2011  *RADIOLOGY REPORT*  Clinical Data:  Abdominal pain  COMPLETE ABDOMINAL ULTRASOUND  Comparison:  None.  Findings:  Gallbladder:  The gallbladder is visualized and multiple gallstones are noted within the gallbladder of 8 mm or less in diameter.  No pain is present over the gallbladder with compression.  Common bile duct:  The common bile duct is normal measuring 1.7 mm in diameter.  Liver:  It the liver has a normal echogenic pattern.  No ductal dilatation is seen.  IVC:  Appears normal.  Pancreas:  No focal abnormality seen.  Spleen:  The spleen is normal measuring 7.4 cm sagittally.  Right Kidney:  No hydronephrosis is seen.  The right kidney measures 11.1 cm sagittally.  Left Kidney:  No hydronephrosis is noted.  The left kidney measures 11.6 cm.  Abdominal aorta:  The abdominal aorta is normal in caliber.  IMPRESSION:  1.  Multiple gallstones.  No pain over the gallbladder. 2.  No ductal dilatation.    Original Report Authenticated By: Dwyane Dee, M.D.       Review of Systems  Constitutional: Negative for fever, chills, diaphoresis, appetite change and fatigue.  HENT: Negative for ear pain, sore throat, trouble swallowing, neck pain and ear discharge.   Eyes: Negative for photophobia, discharge and visual disturbance.  Respiratory: Negative for cough, choking, chest tightness and shortness of breath.   Cardiovascular: Negative for chest pain and palpitations.  Gastrointestinal: Positive for abdominal pain. Negative for nausea, vomiting, diarrhea, blood in stool, anal bleeding and rectal pain.  Genitourinary: Negative for dysuria, frequency  and difficulty urinating.  Musculoskeletal: Positive for back pain. Negative for myalgias and gait problem.  Skin: Negative for color change, pallor, rash and wound.  Neurological: Negative for dizziness, speech difficulty, weakness and numbness.  Hematological: Negative for adenopathy.  Psychiatric/Behavioral: Negative for confusion and agitation. The patient is not nervous/anxious.        Objective:   Physical Exam  Constitutional: She is oriented to person, place, and time. She appears well-developed and well-nourished. No distress.  HENT:  Head: Normocephalic.  Mouth/Throat: Oropharynx is clear and moist. No oropharyngeal exudate.  Eyes: Conjunctivae normal and EOM are normal. Pupils are equal, round, and reactive to light. No scleral icterus.  Neck: Normal range of motion. Neck supple. No tracheal deviation present.  Cardiovascular: Normal rate, regular rhythm and intact distal pulses.   Pulmonary/Chest: Effort normal and breath sounds normal. No respiratory distress. She exhibits no tenderness.  Abdominal: Soft. She exhibits no distension and no mass. There is no tenderness. Hernia confirmed negative in the right inguinal area and confirmed negative in the left inguinal area.  Genitourinary: No vaginal discharge found.  Musculoskeletal:  Normal range of motion. She exhibits no tenderness.  Lymphadenopathy:    She has no cervical adenopathy.       Right: No inguinal adenopathy present.       Left: No inguinal adenopathy present.  Neurological: She is alert and oriented to person, place, and time. No cranial nerve deficit. She exhibits normal muscle tone. Coordination normal.  Skin: Skin is warm and dry. No rash noted. She is not diaphoretic. No erythema.  Psychiatric: She has a normal mood and affect. Her behavior is normal. Judgment and thought content normal.       Assessment:     Postprandial pain and discomfort with gallstones.  Three severe attacks in the past year.  Strongly suspicious for chronic cholecystitis.    Plan:     I went over the differential diagnosis numerous times.  I am skeptical there could be another etiology aside from her gallbladder.  I recommended surgery:  The anatomy & physiology of hepatobiliary & pancreatic function was discussed.  The pathophysiology of gallbladder dysfunction was discussed.  Natural history risks without surgery was discussed.   I feel the risks of no intervention will lead to serious problems that outweigh the operative risks; therefore, I recommended cholecystectomy to remove the pathology.  I explained laparoscopic techniques with possible need for an open approach.  Probable cholangiogram to evaluate the bilary tract was explained as well.    Risks such as bleeding, infection, abscess, leak, injury to other organs, need for further treatment, heart attack, death, and other risks were discussed.  I noted a good likelihood this will help address the problem.  Possibility that this will not correct all abdominal symptoms was explained.  Goals of post-operative recovery were discussed as well.  We will work to minimize complications.  An educational handout further explaining the pathology and treatment options was given as well.  Questions were answered.  The patient expresses  understanding & wishes to think about surgery.

## 2012-01-08 ENCOUNTER — Telehealth: Payer: Self-pay

## 2012-01-08 NOTE — Telephone Encounter (Signed)
Pt is faxing over a form that she says Dr. Cleta Alberts needs to sign off on.  She has to have this by Friday she says.  Call when ready for pick up [phone]

## 2012-01-08 NOTE — Telephone Encounter (Signed)
Form received. Will take to Dr. Ellis Parents box for review.

## 2012-06-09 ENCOUNTER — Ambulatory Visit (INDEPENDENT_AMBULATORY_CARE_PROVIDER_SITE_OTHER): Payer: BC Managed Care – PPO | Admitting: Surgery

## 2012-06-09 ENCOUNTER — Encounter (INDEPENDENT_AMBULATORY_CARE_PROVIDER_SITE_OTHER): Payer: Self-pay | Admitting: Surgery

## 2012-06-09 VITALS — BP 122/68 | HR 64 | Temp 97.3°F | Resp 16 | Ht 67.0 in | Wt 141.2 lb

## 2012-06-09 DIAGNOSIS — K8010 Calculus of gallbladder with chronic cholecystitis without obstruction: Secondary | ICD-10-CM

## 2012-06-09 NOTE — Patient Instructions (Addendum)
Cholecystitis Cholecystitis is an inflammation of your gallbladder. It is usually caused by a buildup of gallstones or sludge (cholelithiasis) in your gallbladder. The gallbladder stores a fluid that helps digest fats (bile). Cholecystitis is serious and needs treatment right away.  CAUSES   Gallstones. Gallstones can block the tube that leads to your gallbladder, causing bile to build up. As bile builds up, the gallbladder becomes inflamed.  Bile duct problems, such as blockage from scarring or kinking.  Tumors. Tumors can stop bile from leaving your gallbladder correctly, causing bile to build up. As bile builds up, the gallbladder becomes inflamed. SYMPTOMS   Nausea.  Vomiting.  Abdominal pain, especially in the upper right area of your abdomen.  Abdominal tenderness or bloating.  Sweating.  Chills.  Fever.  Yellowing of the skin and the whites of the eyes (jaundice). DIAGNOSIS  Your caregiver may order blood tests to look for infection or gallbladder problems. Your caregiver may also order imaging tests, such as an ultrasound or computed tomography (CT) scan. Further tests may include a hepatobiliary iminodiacetic acid (HIDA) scan. This scan allows your caregiver to see your bile move from the liver to the gallbladder and to the small intestine. TREATMENT  A hospital stay is usually necessary to lessen the inflammation of your gallbladder. You may be required to not eat or drink (fast) for a certain amount of time. You may be given medicine to treat pain or an antibiotic medicine to treat an infection. Surgery may be needed to remove your gallbladder (cholecystectomy) once the inflammation has gone down. Surgery may be needed right away if you develop complications such as death of gallbladder tissue (gangrene) or a tear (perforation) of the gallbladder.  HOME CARE INSTRUCTIONS  Home care will depend on your treatment. In general:  If you were given antibiotics, take them as  directed. Finish them even if you start to feel better.  Only take over-the-counter or prescription medicines for pain, discomfort, or fever as directed by your caregiver.  Follow a low-fat diet until you see your caregiver again.  Keep all follow-up visits as directed by your caregiver. SEEK IMMEDIATE MEDICAL CARE IF:   Your pain is increasing and not controlled by medicines.  Your pain moves to another part of your abdomen or to your back.  You have a fever.  You have nausea and vomiting. MAKE SURE YOU:  Understand these instructions.  Will watch your condition.  Will get help right away if you are not doing well or get worse. Document Released: 01/28/2005 Document Revised: 04/22/2011 Document Reviewed: 12/14/2010 [facility] Patient Information 2013 [place], [place].   LAPAROSCOPIC SURGERY: POST OP INSTRUCTIONS  1. DIET: Follow a light bland diet the first 24 hours after arrival home, such as soup, liquids, crackers, etc.  Be sure to include lots of fluids daily.  Avoid fast food or heavy meals as your are more likely to get nauseated.  Eat a low fat the next few days after surgery.   2. Take your usually prescribed home medications unless otherwise directed. 3. PAIN CONTROL: a. Pain is best controlled by a usual combination of three different methods TOGETHER: i. Ice/Heat ii. Over the counter pain medication iii. Prescription pain medication b. Most patients will experience some swelling and bruising around the incisions.  Ice packs or heating pads (30-60 minutes up to 6 times a day) will help. Use ice for the first few days to help decrease swelling and bruising, then switch to heat to help relax  tight/sore spots and speed recovery.  Some people prefer to use ice alone, heat alone, alternating between ice & heat.  Experiment to what works for you.  Swelling and bruising can take several weeks to resolve.   c. It is helpful to take an over-the-counter pain medication regularly  for the first few weeks.  Choose one of the following that works best for you: i. Naproxen (Aleve, etc)  Two 220mg  tabs twice a day ii. Ibuprofen (Advil, etc) Three 200mg  tabs four times a day (every meal & bedtime) iii. Acetaminophen (Tylenol, etc) 500-650mg  four times a day (every meal & bedtime) d. A  prescription for pain medication (such as oxycodone, hydrocodone, etc) should be given to you upon discharge.  Take your pain medication as prescribed.  i. If you are having problems/concerns with the prescription medicine (does not control pain, nausea, vomiting, rash, itching, etc), please call us [phone] to see if we need to switch you to a different pain medicine that will work better for you and/or control your side effect better. ii. If you need a refill on your pain medication, please contact your pharmacy.  They will contact our office to request authorization. Prescriptions will not be filled after 5 pm or on week-ends. 4. Avoid getting constipated.  Between the surgery and the pain medications, it is common to experience some constipation.  Increasing fluid intake and taking a fiber supplement (such as Metamucil, Citrucel, FiberCon, MiraLax, etc) 1-2 times a day regularly will usually help prevent this problem from occurring.  A mild laxative (prune juice, Milk of Magnesia, MiraLax, etc) should be taken according to package directions if there are no bowel movements after 48 hours.   5. Watch out for diarrhea.  If you have many loose bowel movements, simplify your diet to bland foods & liquids for a few days.  Stop any stool softeners and decrease your fiber supplement.  Switching to mild anti-diarrheal medications (Kayopectate, Pepto Bismol) can help.  If this worsens or does not improve, please call us. 6. Wash / shower every day.  You may shower over the dressings as they are waterproof.  Continue to shower over incision(s) after the dressing is off. 7. Remove your waterproof bandages 5  days after surgery.  You may leave the incision open to air.  You may replace a dressing/Band-Aid to cover the incision for comfort if you wish.  8. ACTIVITIES as tolerated:   a. You may resume regular (light) daily activities beginning the next day-such as daily self-care, walking, climbing stairs-gradually increasing activities as tolerated.  If you can walk 30 minutes without difficulty, it is safe to try more intense activity such as jogging, treadmill, bicycling, low-impact aerobics, swimming, etc. b. Save the most intensive and strenuous activity for last such as sit-ups, heavy lifting, contact sports, etc  Refrain from any heavy lifting or straining until you are off narcotics for pain control.   c. DO NOT PUSH THROUGH PAIN.  Let pain be your guide: If it hurts to do something, don't do it.  Pain is your body warning you to avoid that activity for another week until the pain goes down. d. You may drive when you are no longer taking prescription pain medication, you can comfortably wear a seatbelt, and you can safely maneuver your car and apply brakes. e. Bonita Quin may have sexual intercourse when it is comfortable.  9. FOLLOW UP in our office a. Please call [facility] at [phone] to set up an  appointment to see your surgeon in the office for a follow-up appointment approximately 2-3 weeks after your surgery. b. Make sure that you call for this appointment the day you arrive home to insure a convenient appointment time. 10. IF YOU HAVE DISABILITY OR FAMILY LEAVE FORMS, BRING THEM TO THE OFFICE FOR PROCESSING.  DO NOT GIVE THEM TO YOUR DOCTOR.   WHEN TO CALL us [phone]: 1. Poor pain control 2. Reactions / problems with new medications (rash/itching, nausea, etc)  3. Fever over 101.5 F (38.5 C) 4. Inability to urinate 5. Nausea and/or vomiting 6. Worsening swelling or bruising 7. Continued bleeding from incision. 8. Increased pain, redness, or drainage from the incision   The clinic staff  is available to answer your questions during regular business hours (8:30am-5pm).  Please don't hesitate to call and ask to speak to one of our nurses for clinical concerns.   If you have a medical emergency, go to the nearest emergency room or call 911.  A surgeon from [facility] is always on call at the [facility], [place] [address], [address], [place], [place]  [address] ? MAIN: [phone] ? TOLL FREE: [phone] ?  FAX [phone] [email or web address]  Managing Pain  Pain after surgery or related to activity is often due to strain/injury to muscle, tendon, nerves and/or incisions.  This pain is usually short-term and will improve in a few months.   Many people find it helpful to do the following things TOGETHER to help speed the process of healing and to get back to regular activity more quickly:  1. Avoid heavy physical activity a.  no lifting greater than 20 pounds b. Do not "push through" the pain.  Listen to your body and avoid positions and maneuvers than reproduce the pain c. Walking is okay as tolerated, but go slowly and stop when getting sore.  d. Remember: If it hurts to do it, then don't do it! 2. Take Anti-inflammatory medication  a. Take with food/snack around the clock for 1-2 weeks i. This helps the muscle and nerve tissues become less irritable and calm down faster b. Choose ONE of the following over-the-counter medications: i. Naproxen 220mg  tabs (ex. Aleve) 1-2 pills twice a day  ii. Ibuprofen 200mg  tabs (ex. Advil, Motrin) 3-4 pills with every meal and just before bedtime iii. Acetaminophen 500mg  tabs (Tylenol) 1-2 pills with every meal and just before bedtime 3. Use a Heating pad or Ice/Cold Pack a. 4-6 times a day b. May use warm bath/hottub  or showers 4. Try Gentle Massage and/or Stretching  a. at the area of pain many times a day b. stop if you feel pain - do not overdo it  Try these steps  together to help you body heal faster and avoid making things get worse.  Doing just one of these things may not be enough.    If you are not getting better after two weeks or are noticing you are getting worse, contact our office for further advice; we may need to re-evaluate you & see what other things we can do to help.  GETTING TO GOOD BOWEL HEALTH. Irregular bowel habits such as constipation and diarrhea can lead to many problems over time.  Having one soft bowel movement a day is the most important way to prevent further problems.  The anorectal canal is designed to handle stretching and feces to safely manage our ability to get rid of solid waste (  feces, poop, stool) out of our body.  BUT, hard constipated stools can act like ripping concrete bricks and diarrhea can be a burning fire to this very sensitive area of our body, causing inflamed hemorrhoids, anal fissures, increasing risk is perirectal abscesses, abdominal pain/bloating, an making irritable bowel worse.     The goal: ONE SOFT BOWEL MOVEMENT A DAY!  To have soft, regular bowel movements:    Drink at least 8 tall glasses of water a day.     Take plenty of fiber.  Fiber is the undigested part of plant food that passes into the colon, acting s "natures broom" to encourage bowel motility and movement.  Fiber can absorb and hold large amounts of water. This results in a larger, bulkier stool, which is soft and easier to pass. Work gradually over several weeks up to 6 servings a day of fiber (25g a day even more if needed) in the form of: o Vegetables -- Root (potatoes, carrots, turnips), leafy green (lettuce, salad greens, celery, spinach), or cooked high residue (cabbage, broccoli, etc) o Fruit -- Fresh (unpeeled skin & pulp), Dried (prunes, apricots, cherries, etc ),  or stewed ( applesauce)  o Whole grain breads, pasta, etc (whole wheat)  o Bran cereals    Bulking Agents -- This type of water-retaining fiber generally is easily obtained  each day by one of the following:  o Psyllium bran -- The psyllium plant is remarkable because its ground seeds can retain so much water. This product is available as Metamucil, Konsyl, Effersyllium, Per Diem Fiber, or the less expensive generic preparation in drug and health food stores. Although labeled a laxative, it really is not a laxative.  o Methylcellulose -- This is another fiber derived from wood which also retains water. It is available as Citrucel. o Polyethylene Glycol - and "artificial" fiber commonly called Miralax or Glycolax.  It is helpful for people with gassy or bloated feelings with regular fiber o Flax Seed - a less gassy fiber than psyllium   No reading or other relaxing activity while on the toilet. If bowel movements take longer than 5 minutes, you are too constipated   AVOID CONSTIPATION.  High fiber and water intake usually takes care of this.  Sometimes a laxative is needed to stimulate more frequent bowel movements, but    Laxatives are not a good long-term solution as it can wear the colon out. o Osmotics (Milk of Magnesia, Fleets phosphosoda, Magnesium citrate, MiraLax, GoLytely) are safer than  o Stimulants (Senokot, Castor Oil, Dulcolax, Ex Lax)    o Do not take laxatives for more than 7days in a row.    IF SEVERELY CONSTIPATED, try a Bowel Retraining Program: o Do not use laxatives.  o Eat a diet high in roughage, such as bran cereals and leafy vegetables.  o Drink six (6) ounces of prune or apricot juice each morning.  o Eat two (2) large servings of stewed fruit each day.  o Take one (1) heaping tablespoon of a psyllium-based bulking agent twice a day. Use sugar-free sweetener when possible to avoid excessive calories.  o Eat a normal breakfast.  o Set aside 15 minutes after breakfast to sit on the toilet, but do not strain to have a bowel movement.  o If you do not have a bowel movement by the third day, use an enema and repeat the above steps.    Controlling  diarrhea o Switch to liquids and simpler foods for a few days  to avoid stressing your intestines further. o Avoid dairy products (especially milk & ice cream) for a short time.  The intestines often can lose the ability to digest lactose when stressed. o Avoid foods that cause gassiness or bloating.  Typical foods include beans and other legumes, cabbage, broccoli, and dairy foods.  Every person has some sensitivity to other foods, so listen to our body and avoid those foods that trigger problems for you. o Adding fiber (Citrucel, Metamucil, psyllium, Miralax) gradually can help thicken stools by absorbing excess fluid and retrain the intestines to act more normally.  Slowly increase the dose over a few weeks.  Too much fiber too soon can backfire and cause cramping & bloating. o Probiotics (such as active yogurt, Align, etc) may help repopulate the intestines and colon with normal bacteria and calm down a sensitive digestive tract.  Most studies show it to be of mild help, though, and such products can be costly. o Medicines:   Bismuth subsalicylate (ex. Kayopectate, Pepto Bismol) every 30 minutes for up to 6 doses can help control diarrhea.  Avoid if pregnant.   Loperamide (Immodium) can slow down diarrhea.  Start with two tablets (4mg  total) first and then try one tablet every 6 hours.  Avoid if you are having fevers or severe pain.  If you are not better or start feeling worse, stop all medicines and call your doctor for advice o Call your doctor if you are getting worse or not better.  Sometimes further testing (cultures, endoscopy, X-ray studies, bloodwork, etc) may be needed to help diagnose and treat the cause of the diarrhea. o

## 2012-06-09 NOTE — Progress Notes (Signed)
Subjective:     Patient ID: GERALDEAN WALEN, female   DOB: 12-18-1970, 42 y.o.   MRN: [id number]  HPI   JAZMAN REUTER  09/24/1970 [id number]  Patient Care Team: Collene Gobble, MD as PCP - General (Family Medicine) Shirley Friar, MD as Consulting Physician (Gastroenterology)  This patient is a 42 y.o.female who presents today for surgical evaluation at the request of Dr. Cleta Alberts.   Reason for evaluation: Persistent attacks of postprandial pain with gallstones.  Reconsidering surgery  Pleasant active woman.  Works as a Engineer, civil (consulting) at [employer].  Has had three episodes of severe upper abdominal/chest pain.  Waking her up at night.  Two episodes were after eating [place] food.  Another one was after eating a funnel cake.  Felt uncomfortable and bloated.  No severe nausea or vomiting.  Radiated to her back.  No one else was sick.    She has a history of chronic reflux.  Followed by Dr. Bosie Clos.  Had endoscopy 2013 that was underwhelming. GERD stable & controlled on Pepcid twice a day.  Normally has a bowel movement every other day.   She can walk several miles without difficulty.  No family history of bowel problems.  Occasionally has a glass of wine at night.  No difficulty with tolerating that.  No episodes of hepatitis nor pancreatitis.  No personal nor family history of GI/colon cancer, inflammatory bowel disease, irritable bowel syndrome, allergy such as Celiac Sprue, dietary/dairy problems, colitis, ulcers nor gastritis.  No recent sick contacts/gastroenteritis.  No travel outside the country.  No changes in diet.  She has had some episodes of itching.  No jaundice.  No dark-colored urine.  No light stools.  No change in eye/skin color.  I saw her in the Fall 2013.  I was very suspicious for biliary colic and chronic cholecystitis.  I recommended surgery.  She did not schedule.  She has had two more attacks since I saw her.  She is surprised that she can tolerate olive oil and avocados  without difficulty, but other heavy foods will trigger the attacks.  She is trying to avoid fast food.  She has avoided [place] food.  She still getting attacks.  She is more interested in surgery now.  Otherwise, no new events.    Patient Active Problem List   Diagnosis Date Noted  . Chronic cholecystitis with calculus 12/30/2011  . PLEVA (pityriasis lichenoides et varioliformis acuta) 12/10/2011    Past Medical History  Diagnosis Date  . GERD (gastroesophageal reflux disease)   . Thyroid disease     hypothyroidism  . Gallstones     History reviewed. No pertinent past surgical history.  History   Social History  . Marital Status: Single    Spouse Name: N/A    Number of Children: N/A  . Years of Education: N/A   Occupational History  . Not on file.   Social History Main Topics  . Smoking status: Former Games developer  . Smokeless tobacco: Not on file     Comment: smoked occasionally in college  . Alcohol Use: Yes     Comment: social  . Drug Use: No  . Sexually Active: Not on file   Other Topics Concern  . Not on file   Social History Narrative  . No narrative on file    Family History  Problem Relation Age of Onset  . Cancer Mother     breast    Current Outpatient Prescriptions  Medication Sig Dispense  Refill  . lansoprazole (PREVACID) 15 MG capsule Take 15 mg by mouth daily.      Marland Kitchen levonorgestrel (MIRENA) 20 MCG/24HR IUD 1 each by Intrauterine route once.      Marland Kitchen levothyroxine (SYNTHROID, LEVOTHROID) 137 MCG tablet Take 1 tablet (137 mcg total) by mouth daily.  30 tablet  11  . sertraline (ZOLOFT) 25 MG tablet Take 1 tablet (25 mg total) by mouth daily.  90 tablet  3  . ibuprofen (ADVIL,MOTRIN) 200 MG tablet Take 200 mg by mouth every 6 (six) hours as needed.       No current facility-administered medications for this visit.     Allergies  Allergen Reactions  . Amoxicillin Diarrhea  . Augmentin (Amoxicillin-Pot Clavulanate) Diarrhea  . Doxycycline   .  Shellfish Allergy     BP 122/68  Pulse 64  Temp(Src) 97.3 F (36.3 C) (Temporal)  Resp 16  Ht 5\' 7"  (1.702 m)  Wt 141 lb 3.2 oz (64.048 kg)  BMI 22.11 kg/m2  Dg Chest 2 View  12/10/2011  *RADIOLOGY REPORT*  Clinical Data: Screening for lymphoma.  CHEST - 2 VIEW  Comparison: None  Findings: Midline trachea.  Normal heart size and mediastinal contours. No pleural effusion or pneumothorax.  Minimal biapical pleural parenchymal scarring. Clear lungs.  IMPRESSION: No acute process or evidence of lymphoma.  No evidence of adenopathy in the chest.   Original Report Authenticated By: Consuello Bossier, M.D.    Dg Hip Complete Left  12/10/2011  *RADIOLOGY REPORT*  Clinical Data: Hip pain. No trauma history submitted.  LEFT HIP - COMPLETE 2+ VIEW  Comparison: None.  Findings: AP and frog-leg views. No acute fracture or dislocation. Joint spaces maintained.  No evidence of avascular necrosis.  IMPRESSION: Normal left hip.   Original Report Authenticated By: Consuello Bossier, M.D.    Dg Hip Complete Right  12/10/2011  *RADIOLOGY REPORT*  Clinical Data: Hip pain. No trauma history submitted.  RIGHT HIP - COMPLETE 2+ VIEW  Comparison: None.  Findings: AP view of the pelvis and frog-leg view the right hip. Femoral heads are located.  Intrauterine device. Sacroiliac joints are symmetric.  No acute fracture.  Joint spaces maintained.  IMPRESSION: No acute osseous abnormality.   Original Report Authenticated By: Consuello Bossier, M.D.    US Abdomen Complete  12/13/2011  *RADIOLOGY REPORT*  Clinical Data:  Abdominal pain  COMPLETE ABDOMINAL ULTRASOUND  Comparison:  None.  Findings:  Gallbladder:  The gallbladder is visualized and multiple gallstones are noted within the gallbladder of 8 mm or less in diameter.  No pain is present over the gallbladder with compression.  Common bile duct:  The common bile duct is normal measuring 1.7 mm in diameter.  Liver:  It the liver has a normal echogenic pattern.  No ductal  dilatation is seen.  IVC:  Appears normal.  Pancreas:  No focal abnormality seen.  Spleen:  The spleen is normal measuring 7.4 cm sagittally.  Right Kidney:  No hydronephrosis is seen.  The right kidney measures 11.1 cm sagittally.  Left Kidney:  No hydronephrosis is noted.  The left kidney measures 11.6 cm.  Abdominal aorta:  The abdominal aorta is normal in caliber.  IMPRESSION:  1.  Multiple gallstones.  No pain over the gallbladder. 2.  No ductal dilatation.   Original Report Authenticated By: Dwyane Dee, M.D.       Review of Systems  Constitutional: Negative for fever, chills, diaphoresis, appetite change and fatigue.  HENT:  Negative for ear pain, sore throat, trouble swallowing, neck pain and ear discharge.   Eyes: Negative for photophobia, discharge and visual disturbance.  Respiratory: Negative for cough, choking, chest tightness and shortness of breath.   Cardiovascular: Negative for chest pain and palpitations.  Gastrointestinal: Positive for nausea and abdominal pain. Negative for vomiting, diarrhea, constipation, blood in stool, abdominal distention, anal bleeding and rectal pain.  Endocrine: Negative for cold intolerance and heat intolerance.  Genitourinary: Negative for dysuria, frequency and difficulty urinating.  Musculoskeletal: Negative for myalgias, back pain and gait problem.  Skin: Negative for color change, pallor, rash and wound.  Allergic/Immunologic: Negative for environmental allergies, food allergies and immunocompromised state.  Neurological: Negative for dizziness, speech difficulty, weakness and numbness.  Hematological: Negative for adenopathy.  Psychiatric/Behavioral: Negative for confusion and agitation. The patient is not nervous/anxious.        Objective:   Physical Exam  Constitutional: She is oriented to person, place, and time. She appears well-developed and well-nourished. No distress.  HENT:  Head: Normocephalic.  Mouth/Throat: Oropharynx is clear  and moist. No oropharyngeal exudate.  Eyes: Conjunctivae and EOM are normal. Pupils are equal, round, and reactive to light. No scleral icterus.  Neck: Normal range of motion. Neck supple. No tracheal deviation present.  Cardiovascular: Normal rate, regular rhythm and intact distal pulses.   Pulmonary/Chest: Effort normal and breath sounds normal. No respiratory distress. She exhibits no tenderness.  Abdominal: Soft. She exhibits no distension and no mass. There is no tenderness. Hernia confirmed negative in the right inguinal area and confirmed negative in the left inguinal area.  Genitourinary: No vaginal discharge found.  Musculoskeletal: Normal range of motion. She exhibits no tenderness.  Lymphadenopathy:    She has no cervical adenopathy.       Right: No inguinal adenopathy present.       Left: No inguinal adenopathy present.  Neurological: She is alert and oriented to person, place, and time. No cranial nerve deficit. She exhibits normal muscle tone. Coordination normal.  Skin: Skin is warm and dry. No rash noted. She is not diaphoretic. No erythema.  Psychiatric: She has a normal mood and affect. Her behavior is normal. Judgment and thought content normal.       Assessment:     Postprandial pain and discomfort with gallstones.  Three severe attacks in the past year, two more this year.  Strongly suspicious for chronic cholecystitis.    Plan:     I went over the differential diagnosis numerous times.  I am skeptical there could be another etiology aside from her gallbladder.  I again recommended surgery:  The anatomy & physiology of hepatobiliary & pancreatic function was discussed.  The pathophysiology of gallbladder dysfunction was discussed.  Natural history risks without surgery was discussed.   I feel the risks of no intervention will lead to serious problems that outweigh the operative risks; therefore, I recommended cholecystectomy to remove the pathology.  I explained  laparoscopic techniques with possible need for an open approach.  Probable cholangiogram to evaluate the bilary tract was explained as well.    Risks such as bleeding, infection, abscess, leak, injury to other organs, need for further treatment, heart attack, death, and other risks were discussed.  I noted a good likelihood this will help address the problem.  Possibility that this will not correct all abdominal symptoms was explained.  Goals of post-operative recovery were discussed as well.  We will work to minimize complications.  An educational handout further explaining  the pathology and treatment options was given as well.  Questions were answered.  She seems more convinced now.The patient expresses understanding & wishes to proceed with surgery.  Ardeth Sportsman, M.D., F.A.C.S. Gastrointestinal and Minimally Invasive [facility] [address], [address] [place], [place] [address] [phone] Main / Paging

## 2012-06-19 ENCOUNTER — Other Ambulatory Visit (INDEPENDENT_AMBULATORY_CARE_PROVIDER_SITE_OTHER): Payer: Self-pay | Admitting: Surgery

## 2012-06-19 ENCOUNTER — Telehealth (INDEPENDENT_AMBULATORY_CARE_PROVIDER_SITE_OTHER): Payer: Self-pay | Admitting: *Deleted

## 2012-06-19 DIAGNOSIS — K8010 Calculus of gallbladder with chronic cholecystitis without obstruction: Secondary | ICD-10-CM

## 2012-06-19 NOTE — Telephone Encounter (Signed)
Patient called to state she is feeling nauseated following her surgery this morning and is asking for some type of nausea medication to be called into her pharmacy. Gross MD paged at this time.

## 2012-06-19 NOTE — Telephone Encounter (Signed)
Dr. Michaell Cowing called to approve Phenergan 25mg  1 tablet every 6 hours as needed for nausea #20, 1 refill.  [facility] [phone]

## 2012-06-30 ENCOUNTER — Encounter (INDEPENDENT_AMBULATORY_CARE_PROVIDER_SITE_OTHER): Payer: Self-pay | Admitting: Surgery

## 2012-07-01 ENCOUNTER — Encounter (INDEPENDENT_AMBULATORY_CARE_PROVIDER_SITE_OTHER): Payer: Self-pay

## 2012-07-08 ENCOUNTER — Ambulatory Visit (INDEPENDENT_AMBULATORY_CARE_PROVIDER_SITE_OTHER): Payer: BC Managed Care – PPO | Admitting: Surgery

## 2012-07-08 ENCOUNTER — Encounter (INDEPENDENT_AMBULATORY_CARE_PROVIDER_SITE_OTHER): Payer: Self-pay | Admitting: Surgery

## 2012-07-08 VITALS — BP 122/68 | HR 76 | Temp 97.8°F | Resp 14 | Ht 67.0 in | Wt 142.4 lb

## 2012-07-08 DIAGNOSIS — K8010 Calculus of gallbladder with chronic cholecystitis without obstruction: Secondary | ICD-10-CM

## 2012-07-08 NOTE — Patient Instructions (Signed)
LAPAROSCOPIC SURGERY: POST OP INSTRUCTIONS ° °1. DIET: Follow a light bland diet the first 24 hours after arrival home, such as soup, liquids, crackers, etc.  Be sure to include lots of fluids daily.  Avoid fast food or heavy meals as your are more likely to get nauseated.  Eat a low fat the next few days after surgery.   °2. Take your usually prescribed home medications unless otherwise directed. °3. PAIN CONTROL: °a. Pain is best controlled by a usual combination of three different methods TOGETHER: °i. Ice/Heat °ii. Over the counter pain medication °iii. Prescription pain medication °b. Most patients will experience some swelling and bruising around the incisions.  Ice packs or heating pads (30-60 minutes up to 6 times a day) will help. Use ice for the first few days to help decrease swelling and bruising, then switch to heat to help relax tight/sore spots and speed recovery.  Some people prefer to use ice alone, heat alone, alternating between ice & heat.  Experiment to what works for you.  Swelling and bruising can take several weeks to resolve.   °c. It is helpful to take an over-the-counter pain medication regularly for the first few weeks.  Choose one of the following that works best for you: °i. Naproxen (Aleve, etc)  Two 220mg tabs twice a day °ii. Ibuprofen (Advil, etc) Three 200mg tabs four times a day (every meal & bedtime) °iii. Acetaminophen (Tylenol, etc) 500-650mg four times a day (every meal & bedtime) °d. A  prescription for pain medication (such as oxycodone, hydrocodone, etc) should be given to you upon discharge.  Take your pain medication as prescribed.  °i. If you are having problems/concerns with the prescription medicine (does not control pain, nausea, vomiting, rash, itching, etc), please call us [phone] to see if we need to switch you to a different pain medicine that will work better for you and/or control your side effect better. °ii. If you need a refill on your pain medication,  please contact your pharmacy.  They will contact our office to request authorization. Prescriptions will not be filled after 5 pm or on week-ends. °4. Avoid getting constipated.  Between the surgery and the pain medications, it is common to experience some constipation.  Increasing fluid intake and taking a fiber supplement (such as Metamucil, Citrucel, FiberCon, MiraLax, etc) 1-2 times a day regularly will usually help prevent this problem from occurring.  A mild laxative (prune juice, Milk of Magnesia, MiraLax, etc) should be taken according to package directions if there are no bowel movements after 48 hours.   °5. Watch out for diarrhea.  If you have many loose bowel movements, simplify your diet to bland foods & liquids for a few days.  Stop any stool softeners and decrease your fiber supplement.  Switching to mild anti-diarrheal medications (Kayopectate, Pepto Bismol) can help.  If this worsens or does not improve, please call us. °6. Wash / shower every day.  You may shower over the dressings as they are waterproof.  Continue to shower over incision(s) after the dressing is off. °7. Remove your waterproof bandages 5 days after surgery.  You may leave the incision open to air.  You may replace a dressing/Band-Aid to cover the incision for comfort if you wish.  °8. ACTIVITIES as tolerated:   °a. You may resume regular (light) daily activities beginning the next day--such as daily self-care, walking, climbing stairs--gradually increasing activities as tolerated.  If you can walk 30 minutes without difficulty, it   is safe to try more intense activity such as jogging, treadmill, bicycling, low-impact aerobics, swimming, etc. °b. Save the most intensive and strenuous activity for last such as sit-ups, heavy lifting, contact sports, etc  Refrain from any heavy lifting or straining until you are off narcotics for pain control.   °c. DO NOT PUSH THROUGH PAIN.  Let pain be your guide: If it hurts to do something, don't  do it.  Pain is your body warning you to avoid that activity for another week until the pain goes down. °d. You may drive when you are no longer taking prescription pain medication, you can comfortably wear a seatbelt, and you can safely maneuver your car and apply brakes. °e. You may have sexual intercourse when it is comfortable.  °9. FOLLOW UP in our office °a. Please call [facility] at [phone] to set up an appointment to see your surgeon in the office for a follow-up appointment approximately 2-3 weeks after your surgery. °b. Make sure that you call for this appointment the day you arrive home to insure a convenient appointment time. °10. IF YOU HAVE DISABILITY OR FAMILY LEAVE FORMS, BRING THEM TO THE OFFICE FOR PROCESSING.  DO NOT GIVE THEM TO YOUR DOCTOR. ° ° °WHEN TO CALL US [phone]: °1. Poor pain control °2. Reactions / problems with new medications (rash/itching, nausea, etc)  °3. Fever over 101.5 F (38.5 C) °4. Inability to urinate °5. Nausea and/or vomiting °6. Worsening swelling or bruising °7. Continued bleeding from incision. °8. Increased pain, redness, or drainage from the incision ° ° The clinic staff is available to answer your questions during regular business hours (8:30am-5pm).  Please don’t hesitate to call and ask to speak to one of our nurses for clinical concerns.  ° If you have a medical emergency, go to the nearest emergency room or call 911. ° A surgeon from [facility] is always on call at the hospitals ° ° °[facility] °[address], [address], [place], [place]  [address] ? °MAIN: [phone] ? TOLL FREE: [phone] ?  °FAX [phone] °[email or web address] ° °Managing Pain ° °Pain after surgery or related to activity is often due to strain/injury to muscle, tendon, nerves and/or incisions.  This pain is usually short-term and will improve in a few months.  ° °Many people find it helpful to do the following things TOGETHER  to help speed the process of healing and to get back to regular activity more quickly: ° °1. Avoid heavy physical activity °a.  no lifting greater than 20 pounds °b. Do not “push through” the pain.  Listen to your body and avoid positions and maneuvers than reproduce the pain °c. Walking is okay as tolerated, but go slowly and stop when getting sore.  °d. Remember: If it hurts to do it, then don’t do it! °2. Take Anti-inflammatory medication  °a. Take with food/snack around the clock for 1-2 weeks °i. This helps the muscle and nerve tissues become less irritable and calm down faster °b. Choose ONE of the following over-the-counter medications: °i. Naproxen 220mg tabs (ex. Aleve) 1-2 pills twice a day  °ii. Ibuprofen 200mg tabs (ex. Advil, Motrin) 3-4 pills with every meal and just before bedtime °iii. Acetaminophen 500mg tabs (Tylenol) 1-2 pills with every meal and just before bedtime °3. Use a Heating pad or Ice/Cold Pack °a. 4-6 times a day °b. May use warm bath/hottub  or showers °4. Try Gentle Massage and/or Stretching  °a.   at the area of pain many times a day °b. stop if you feel pain - do not overdo it ° °Try these steps together to help you body heal faster and avoid making things get worse.  Doing just one of these things may not be enough.   ° °If you are not getting better after two weeks or are noticing you are getting worse, contact our office for further advice; we may need to re-evaluate you & see what other things we can do to help. ° °GETTING TO GOOD BOWEL HEALTH. °Irregular bowel habits such as constipation and diarrhea can lead to many problems over time.  Having one soft bowel movement a day is the most important way to prevent further problems.  The anorectal canal is designed to handle stretching and feces to safely manage our ability to get rid of solid waste (feces, poop, stool) out of our body.  BUT, hard constipated stools can act like ripping concrete bricks and diarrhea can be a burning  fire to this very sensitive area of our body, causing inflamed hemorrhoids, anal fissures, increasing risk is perirectal abscesses, abdominal pain/bloating, an making irritable bowel worse.     °The goal: ONE SOFT BOWEL MOVEMENT A DAY!  To have soft, regular bowel movements:  °• Drink at least 8 tall glasses of water a day.   °• Take plenty of fiber.  Fiber is the undigested part of plant food that passes into the colon, acting s “natures broom” to encourage bowel motility and movement.  Fiber can absorb and hold large amounts of water. This results in a larger, bulkier stool, which is soft and easier to pass. Work gradually over several weeks up to 6 servings a day of fiber (25g a day even more if needed) in the form of: °o Vegetables -- Root (potatoes, carrots, turnips), leafy green (lettuce, salad greens, celery, spinach), or cooked high residue (cabbage, broccoli, etc) °o Fruit -- Fresh (unpeeled skin & pulp), Dried (prunes, apricots, cherries, etc ),  or stewed ( applesauce)  °o Whole grain breads, pasta, etc (whole wheat)  °o Bran cereals  °• Bulking Agents -- This type of water-retaining fiber generally is easily obtained each day by one of the following:  °o Psyllium bran -- The psyllium plant is remarkable because its ground seeds can retain so much water. This product is available as Metamucil, Konsyl, Effersyllium, Per Diem Fiber, or the less expensive generic preparation in drug and health food stores. Although labeled a laxative, it really is not a laxative.  °o Methylcellulose -- This is another fiber derived from wood which also retains water. It is available as Citrucel. °o Polyethylene Glycol - and “artificial” fiber commonly called Miralax or Glycolax.  It is helpful for people with gassy or bloated feelings with regular fiber °o Flax Seed - a less gassy fiber than psyllium °• No reading or other relaxing activity while on the toilet. If bowel movements take longer than 5 minutes, you are too  constipated °• AVOID CONSTIPATION.  High fiber and water intake usually takes care of this.  Sometimes a laxative is needed to stimulate more frequent bowel movements, but  °• Laxatives are not a good long-term solution as it can wear the colon out. °o Osmotics (Milk of Magnesia, Fleets phosphosoda, Magnesium citrate, MiraLax, GoLytely) are safer than  °o Stimulants (Senokot, Castor Oil, Dulcolax, Ex Lax)    °o Do not take laxatives for more than 7days in a row. °•  IF   SEVERELY CONSTIPATED, try a Bowel Retraining Program: °o Do not use laxatives.  °o Eat a diet high in roughage, such as bran cereals and leafy vegetables.  °o Drink six (6) ounces of prune or apricot juice each morning.  °o Eat two (2) large servings of stewed fruit each day.  °o Take one (1) heaping tablespoon of a psyllium-based bulking agent twice a day. Use sugar-free sweetener when possible to avoid excessive calories.  °o Eat a normal breakfast.  °o Set aside 15 minutes after breakfast to sit on the toilet, but do not strain to have a bowel movement.  °o If you do not have a bowel movement by the third day, use an enema and repeat the above steps.  °• Controlling diarrhea °o Switch to liquids and simpler foods for a few days to avoid stressing your intestines further. °o Avoid dairy products (especially milk & ice cream) for a short time.  The intestines often can lose the ability to digest lactose when stressed. °o Avoid foods that cause gassiness or bloating.  Typical foods include beans and other legumes, cabbage, broccoli, and dairy foods.  Every person has some sensitivity to other foods, so listen to our body and avoid those foods that trigger problems for you. °o Adding fiber (Citrucel, Metamucil, psyllium, Miralax) gradually can help thicken stools by absorbing excess fluid and retrain the intestines to act more normally.  Slowly increase the dose over a few weeks.  Too much fiber too soon can backfire and cause cramping &  bloating. °o Probiotics (such as active yogurt, Align, etc) may help repopulate the intestines and colon with normal bacteria and calm down a sensitive digestive tract.  Most studies show it to be of mild help, though, and such products can be costly. °o Medicines: °- Bismuth subsalicylate (ex. Kayopectate, Pepto Bismol) every 30 minutes for up to 6 doses can help control diarrhea.  Avoid if pregnant. °- Loperamide (Immodium) can slow down diarrhea.  Start with two tablets (4mg total) first and then try one tablet every 6 hours.  Avoid if you are having fevers or severe pain.  If you are not better or start feeling worse, stop all medicines and call your doctor for advice °o Call your doctor if you are getting worse or not better.  Sometimes further testing (cultures, endoscopy, X-ray studies, bloodwork, etc) may be needed to help diagnose and treat the cause of the diarrhea. °o  °

## 2012-07-08 NOTE — Progress Notes (Signed)
Subjective:     Patient ID: Kimberly Mooney, female   DOB: 28-Nov-1970, 42 y.o.   MRN: [id number]  HPI  Kimberly Mooney  09-07-1970 [id number]  Patient Care Team: Collene Gobble, MD as PCP - General (Family Medicine) Shirley Friar, MD as Consulting Physician (Gastroenterology) Ardeth Sportsman, MD as Consulting Physician (General Surgery)  This patient is a 42 y.o.female who presents today for surgical evaluation status post laparoscopic cholecystectomy 06/19/2012.  The patient comes in today feeling well.  Mild loose stools three times a day.  Back on regular diet.  No fevers or chills.  Energy level good.  Off pain medicines.  In good spirits  Patient Active Problem List   Diagnosis Date Noted  . Chronic cholecystitis with calculus 12/30/2011  . PLEVA (pityriasis lichenoides et varioliformis acuta) 12/10/2011    Past Medical History  Diagnosis Date  . GERD (gastroesophageal reflux disease)   . Thyroid disease     hypothyroidism  . Gallstones     History reviewed. No pertinent past surgical history.  History   Social History  . Marital Status: Single    Spouse Name: N/A    Number of Children: N/A  . Years of Education: N/A   Occupational History  . Not on file.   Social History Main Topics  . Smoking status: Former Games developer  . Smokeless tobacco: Not on file     Comment: smoked occasionally in college  . Alcohol Use: Yes     Comment: social  . Drug Use: No  . Sexually Active: Not on file   Other Topics Concern  . Not on file   Social History Narrative  . No narrative on file    Family History  Problem Relation Age of Onset  . Cancer Mother     breast    Current Outpatient Prescriptions  Medication Sig Dispense Refill  . ibuprofen (ADVIL,MOTRIN) 200 MG tablet Take 200 mg by mouth every 6 (six) hours as needed.      . lansoprazole (PREVACID) 15 MG capsule Take 15 mg by mouth daily.      Marland Kitchen levonorgestrel (MIRENA) 20 MCG/24HR IUD 1 each by  Intrauterine route once.      Marland Kitchen levothyroxine (SYNTHROID, LEVOTHROID) 137 MCG tablet Take 1 tablet (137 mcg total) by mouth daily.  30 tablet  11  . promethazine (PHENERGAN) 25 MG tablet       . sertraline (ZOLOFT) 25 MG tablet Take 1 tablet (25 mg total) by mouth daily.  90 tablet  3   No current facility-administered medications for this visit.     Allergies  Allergen Reactions  . Amoxicillin Diarrhea  . Augmentin (Amoxicillin-Pot Clavulanate) Diarrhea  . Doxycycline   . Shellfish Allergy     BP 122/68  Pulse 76  Temp(Src) 97.8 F (36.6 C) (Temporal)  Resp 14  Ht 5\' 7"  (1.702 m)  Wt 142 lb 6.4 oz (64.592 kg)  BMI 22.3 kg/m2  No results found.   Review of Systems  Constitutional: Negative for fever, chills and diaphoresis.  HENT: Negative for ear pain, sore throat and trouble swallowing.   Eyes: Negative for photophobia and visual disturbance.  Respiratory: Negative for cough and choking.   Cardiovascular: Negative for chest pain and palpitations.  Gastrointestinal: Negative for nausea, vomiting, abdominal pain, diarrhea, constipation, anal bleeding and rectal pain.  Genitourinary: Negative for dysuria, frequency and difficulty urinating.  Musculoskeletal: Negative for myalgias and gait problem.  Skin: Negative for color change, pallor  and rash.  Neurological: Negative for dizziness, speech difficulty, weakness and numbness.  Hematological: Negative for adenopathy.  Psychiatric/Behavioral: Negative for confusion and agitation. The patient is not nervous/anxious.        Objective:   Physical Exam  Constitutional: She is oriented to person, place, and time. She appears well-developed and well-nourished. No distress.  HENT:  Head: Normocephalic.  Mouth/Throat: Oropharynx is clear and moist. No oropharyngeal exudate.  Eyes: Conjunctivae and EOM are normal. Pupils are equal, round, and reactive to light. No scleral icterus.  Neck: Normal range of motion. No tracheal  deviation present.  Cardiovascular: Normal rate and intact distal pulses.   Pulmonary/Chest: Effort normal. No respiratory distress. She exhibits no tenderness.  Abdominal: Soft. She exhibits no distension. There is no tenderness. Hernia confirmed negative in the right inguinal area and confirmed negative in the left inguinal area.  Incisions clean with normal healing ridges.  No hernias  Genitourinary: No vaginal discharge found.  Musculoskeletal: Normal range of motion. She exhibits no tenderness.  Lymphadenopathy:       Right: No inguinal adenopathy present.       Left: No inguinal adenopathy present.  Neurological: She is alert and oriented to person, place, and time. No cranial nerve deficit. She exhibits normal muscle tone. Coordination normal.  Skin: Skin is warm and dry. No rash noted. She is not diaphoretic.  Psychiatric: She has a normal mood and affect. Her behavior is normal.       Assessment:     Recovering relatively well a few weeks out from laparoscopic cholecystectomy.     Plan:     Increase activity as tolerated to regular activity.  Low impact exercise such as walking an hour a day at least ideal.  Do not push through pain.  Diet as tolerated.  Low fat high fiber diet ideal.  Bowel regimen with 30 g fiber a day and fiber supplement as needed to avoid problems.  I noted it is common to have loose stools but that gradually improves over time.  A low fat high fiber diet should help that out.  Return to clinic as needed.   Instructions discussed.  Followup with primary care physician for other health issues as would normally be done.  Questions answered.  The patient expressed understanding and appreciation

## 2012-07-16 ENCOUNTER — Ambulatory Visit: Payer: BC Managed Care – PPO

## 2012-07-16 ENCOUNTER — Ambulatory Visit (INDEPENDENT_AMBULATORY_CARE_PROVIDER_SITE_OTHER): Payer: BC Managed Care – PPO | Admitting: Family Medicine

## 2012-07-16 VITALS — BP 126/74 | HR 90 | Temp 98.0°F | Resp 18 | Ht 66.5 in | Wt 140.0 lb

## 2012-07-16 DIAGNOSIS — N949 Unspecified condition associated with female genital organs and menstrual cycle: Secondary | ICD-10-CM

## 2012-07-16 DIAGNOSIS — M545 Low back pain: Secondary | ICD-10-CM

## 2012-07-16 DIAGNOSIS — R002 Palpitations: Secondary | ICD-10-CM

## 2012-07-16 DIAGNOSIS — R202 Paresthesia of skin: Secondary | ICD-10-CM

## 2012-07-16 DIAGNOSIS — M533 Sacrococcygeal disorders, not elsewhere classified: Secondary | ICD-10-CM

## 2012-07-16 DIAGNOSIS — R102 Pelvic and perineal pain: Secondary | ICD-10-CM

## 2012-07-16 DIAGNOSIS — R9431 Abnormal electrocardiogram [ECG] [EKG]: Secondary | ICD-10-CM

## 2012-07-16 DIAGNOSIS — R209 Unspecified disturbances of skin sensation: Secondary | ICD-10-CM

## 2012-07-16 LAB — COMPREHENSIVE METABOLIC PANEL
ALT: 11 U/L (ref 0–35)
AST: 14 U/L (ref 0–37)
Albumin: 4.3 g/dL (ref 3.5–5.2)
Alkaline Phosphatase: 54 U/L (ref 39–117)
Calcium: 9.7 mg/dL (ref 8.4–10.5)
Chloride: 103 mEq/L (ref 96–112)
Potassium: 4.3 mEq/L (ref 3.5–5.3)
Sodium: 139 mEq/L (ref 135–145)
Total Protein: 7.2 g/dL (ref 6.0–8.3)

## 2012-07-16 LAB — CK: Total CK: 50 U/L (ref 7–177)

## 2012-07-16 LAB — POCT CBC
Granulocyte percent: 61.6 %G (ref 37–80)
HCT, POC: 43.2 % (ref 37.7–47.9)
Hemoglobin: 13.5 g/dL (ref 12.2–16.2)
MCV: 95.3 fL (ref 80–97)
POC Granulocyte: 5.4 (ref 2–6.9)
POC LYMPH PERCENT: 33 %L (ref 10–50)
RBC: 4.53 M/uL (ref 4.04–5.48)
RDW, POC: 13.5 %

## 2012-07-16 LAB — TSH: TSH: 5.992 u[IU]/mL — ABNORMAL HIGH (ref 0.350–4.500)

## 2012-07-16 LAB — POCT SEDIMENTATION RATE: POCT SED RATE: 12 mm/hr (ref 0–22)

## 2012-07-16 LAB — VITAMIN B12: Vitamin B-12: 451 pg/mL (ref 211–911)

## 2012-07-16 MED ORDER — MELOXICAM 7.5 MG PO TABS: 7.5000 mg | ORAL_TABLET | Freq: Every day | ORAL | Status: DC

## 2012-07-16 NOTE — Progress Notes (Signed)
Subjective:    Patient ID: Kimberly Mooney, female    DOB: [birth year], 42 y.o.   MRN: [id number]  HPI Kimberly Mooney is a 42 y.o. female Dr. Cleta Alberts is PCP.   Here with multiple concerns today, including back pain for past 2 months, hip pain for past year, tailbone pain, and numbness in arms.   Primary concern today- hip and lower back pain.  Has been seen by Dr. Cleta Alberts for bilaterral hip pain about a year ago - apparently had xrays, told was ok.  Advil as needed. Improved pain when out of work for 2 weeks after cholecystectomy. Back to work 9 days ago - [employer], just more since working,  Taking 600mg  advil BID, or 2 alleve BID.  Some improvement, but more sore today.  "midsection on fire" from back, to around front of hip bones into pelvis. Underwear feels tight on lower abdomen. No dysuria, no frequency/urgency/hematuria. No fever/N/V. numbness and tingling in both feet and into both arms and into breasts - both sides, for past week.  No chest pain. No dyspnea.  No unexplained wt loss or gain. No muscle weakness.  No dysphagia, no slurred speech, no focal weakness. No neck pain currently.  Has stiff neck every few months for a few days - resolves in few days with.  Increased stress - boyfriend recently diagnosed with Parkinson's few weeks ago.  Feels stressed, but does not feel depressed. On zoloft for years for initial anxiety- initially after divorce - last counselor few years ago. Sleeping ok, no anhedonia. Did have numb tongue in past (2-3 years ago) with stress with home improvement, neurology workup including MRI of brain normal. Numbness resolved with in a week.   Mirena in place for approx 3 years.   On synthroid for hypothyroidism - last TSH WNL 05/24/11.   No new meds.   Sledding in January - landed on tailbone.  Still some soreness in that area, not much improvement in symptoms. No bleeding in stool, still some loose bowel movements - but improving with probiotics.   S/p  laparascopic cholecystectomy 06/19/12 - Dr . Michaell Cowing.  Follow up ov at general surgery - 07/08/12. Per that note - few loose stools few times a day, but off pain meds and with regular diet.   RN for Hospice.    Review of Systems  Cardiovascular: Positive for palpitations (occasional skipped beat at night in bed few times per week, no change in frequency. no chest pain, no recetn change in symptoms. ).  Genitourinary: Negative for dysuria, vaginal discharge and menstrual problem (currently on menses - normal timing. ).  Musculoskeletal: Positive for myalgias, back pain and arthralgias.       Objective:   Physical Exam  Vitals reviewed. Constitutional: She is oriented to person, place, and time. She appears well-developed and well-nourished.  HENT:  Head: Normocephalic and atraumatic.  Eyes: Conjunctivae and EOM are normal. Pupils are equal, round, and reactive to light.  Neck: Carotid bruit is not present.  Cardiovascular: Normal rate, regular rhythm, normal heart sounds and intact distal pulses.   Pulmonary/Chest: Effort normal and breath sounds normal.  Abdominal: Soft. She exhibits no pulsatile midline mass. There is no tenderness.  Musculoskeletal:       Right hip: She exhibits normal range of motion, normal strength and no tenderness.       Left hip: She exhibits normal range of motion, normal strength and no tenderness.       Cervical back: She exhibits  normal range of motion (from. ).  Neurological: She is alert and oriented to person, place, and time. She has normal strength. She displays no atrophy and no tremor. No cranial nerve deficit or sensory deficit. She displays a negative Romberg sign. She displays no seizure activity. Coordination and gait normal. GCS eye subscore is 4. GCS verbal subscore is 5. GCS motor subscore is 6. She displays no Babinski's sign on the right side. She displays no Babinski's sign on the left side.  Reflex Scores:      Tricep reflexes are 2+ on the right  side and 2+ on the left side.      Bicep reflexes are 2+ on the right side and 2+ on the left side.      Brachioradialis reflexes are 2+ on the right side and 2+ on the left side.      Patellar reflexes are 2+ on the right side and 2+ on the left side.      Achilles reflexes are 2+ on the right side and 2+ on the left side. nonfocal neuro exam.  Able to heel and toe walk without difficulty.  No upper extremity weakness appreciated.   Skin: Skin is warm and dry.  Psychiatric: She has a normal mood and affect. Her behavior is normal. Judgment and thought content normal.   EKG, Compared to prior EKG: short PR interval - 118. SR - rate 69.  No acute findings otherwise.   Results for orders placed in visit on 07/16/12  POCT CBC      Result Value Range   WBC 8.7  4.6 - 10.2 K/uL   Lymph, poc 2.9  0.6 - 3.4   POC LYMPH PERCENT 33.0  10 - 50 %L   MID (cbc) 0.5  0 - 0.9   POC MID % 5.4  0 - 12 %M   POC Granulocyte 5.4  2 - 6.9   Granulocyte percent 61.6  37 - 80 %G   RBC 4.53  4.04 - 5.48 M/uL   Hemoglobin 13.5  12.2 - 16.2 g/dL   HCT, POC 14.7  82.9 - 47.9 %   MCV 95.3  80 - 97 fL   MCH, POC 29.8  27 - 31.2 pg   MCHC 31.3 (*) 31.8 - 35.4 g/dL   RDW, POC 56.2     Platelet Count, POC 215  142 - 424 K/uL   MPV 12.8  0 - 99.8 fL    [facility] reading (PRIMARY) by  Dr. Neva Seat: ls spine: increased stool noted in abdomen, no acute bony findings.  pelvis: no fx noted, no acute bony findings.  Coccyx: no apparent displaced fx.      Assessment & Plan:  Kimberly Mooney is a 42 y.o. female Palpitation - Plan: TSH, DG Pelvis 1-2 Views, Comprehensive metabolic panel, EKG 12-Lead  Lower back pain - Plan: DG Lumbar Spine Complete, POCT SEDIMENTATION RATE, meloxicam (MOBIC) 7.5 MG tablet  Paresthesias - Plan: DG Lumbar Spine Complete, Vitamin B12, Comprehensive metabolic panel, POCT CBC, POCT SEDIMENTATION RATE  Coccyx pain - Plan: DG Lumbar Spine Complete, DG Pelvis 1-2 Views, CK, POCT  SEDIMENTATION RATE, DG Sacrum/Coccyx, meloxicam (MOBIC) 7.5 MG tablet  Pelvic pain in female - Plan: POCT CBC, DG Sacrum/Coccyx, meloxicam (MOBIC) 7.5 MG tablet  Shortened PR interval  Multiple symptoms above including recurrent lower back pain with pain around to hips, but no pain with ir/er of hip exam. Coccydynia since January be report. No acute findings on xray.  Trial of mobic 7.5 to 15mg  qd prn, tylenol otc prn - call if stronger med needed.  Will check ESR and CK given reported interval increase in sx's and recheck with Dr. Cleta Alberts or myself in next week. Sooner if worse.   Pelvic pain -reassuring cbc.  increased stool on xr. possible radiation of LBP.  tx above, trial of colace,  rtc precautions. Recheck in next week, rtc precautions.   Parasthesias.  Hx of hypothyoidism. nonfocal neuro exam. Check TSH, ESR, B12. recheck as above. May have stress/anxiety component.  Discussed stress mgt and coping techniques with recent news of BF's diagnosis of Parkinson's and has counselor contact info if needed.   Shortened PR interval - borderline at 118.  Can discuss further at follow up but rare palpitation at night - may be PVC.  rtc if increase in frequency of PVC's, lightheadedness or dizziness, or tachycardia.   Coccydynia - as above.  xr report pending.   Meds ordered this encounter  Medications  . meloxicam (MOBIC) 7.5 MG tablet    Sig: Take 1 tablet (7.5 mg total) by mouth daily.    Dispense:  30 tablet    Refill:  0   Patient Instructions  Start the mobic for inflammation(do not combine with other NSAIDs, but ok to take with tylenol).  Let me know if you need something stronger. You should receive a call or letter about your lab results within the next week to 10 days, but recommend follow up with myself (Tuesday or Wednesday next week) or Dr. Cleta Alberts this weekend, Monday or Thursday morning. Can try over the counter Coalce as a stool softener.  Return to the clinic or go to the nearest  emergency room if any of your symptoms worsen or new symptoms occur.

## 2012-07-16 NOTE — Patient Instructions (Signed)
Start the mobic for inflammation(do not combine with other NSAIDs, but ok to take with tylenol).  Let me know if you need something stronger. You should receive a call or letter about your lab results within the next week to 10 days, but recommend follow up with myself (Tuesday or Wednesday next week) or Dr. Cleta Alberts this weekend, Monday or Thursday morning. Can try over the counter Coalce as a stool softener.  Return to the clinic or go to the nearest emergency room if any of your symptoms worsen or new symptoms occur.

## 2012-07-17 ENCOUNTER — Telehealth: Payer: Self-pay | Admitting: Radiology

## 2012-07-17 ENCOUNTER — Telehealth: Payer: Self-pay

## 2012-07-17 NOTE — Telephone Encounter (Signed)
Pt is calling in regards to getting a lifting restriction for her job. She states that she has been taking the mobic as directed however she has been having a hard time lifting people. Best# [phone]

## 2012-07-17 NOTE — Telephone Encounter (Signed)
Kimberly Mooney at 07/17/2012 2:51 PM   Status: Signed            Pt is calling in regards to getting a lifting restriction for her job. She states that she has been taking the mobic as directed however she has been having a hard time lifting people.  Best# [phone]

## 2012-07-17 NOTE — Telephone Encounter (Signed)
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 Would we be able to put her on restrictions? Please advise.

## 2012-07-17 NOTE — Telephone Encounter (Signed)
Incorrect patient name.

## 2012-07-18 ENCOUNTER — Ambulatory Visit (INDEPENDENT_AMBULATORY_CARE_PROVIDER_SITE_OTHER): Payer: BC Managed Care – PPO | Admitting: Emergency Medicine

## 2012-07-18 ENCOUNTER — Ambulatory Visit: Payer: BC Managed Care – PPO

## 2012-07-18 VITALS — BP 131/87 | HR 93 | Temp 98.5°F | Resp 17 | Ht 66.5 in | Wt 139.0 lb

## 2012-07-18 DIAGNOSIS — R209 Unspecified disturbances of skin sensation: Secondary | ICD-10-CM

## 2012-07-18 DIAGNOSIS — R200 Anesthesia of skin: Secondary | ICD-10-CM

## 2012-07-18 DIAGNOSIS — R202 Paresthesia of skin: Secondary | ICD-10-CM

## 2012-07-18 MED ORDER — LEVOTHYROXINE SODIUM 137 MCG PO TABS: 137.0000 ug | ORAL_TABLET | Freq: Every day | ORAL | Status: DC

## 2012-07-18 MED ORDER — SERTRALINE HCL 25 MG PO TABS: 25.0000 mg | ORAL_TABLET | Freq: Every day | ORAL | Status: DC

## 2012-07-18 NOTE — Progress Notes (Signed)
  Subjective:    Patient ID: Kimberly Mooney, female    DOB: Jan 25, 1971, 42 y.o.   MRN: [id number]  HPI presents today with pain in buttcok/back pain. The pain is a burning sensation. Having numbness/tingling both arms and both legs. midback pain has been going on for about 2 months. The buttock pain has been severe for the past week. She was seen 07/16/12 and was prescribed mobic bid.      Review of Systems     Objective:   Physical Exam there is tenderness over the lower lumbar spine. Straight leg raising is negative bilaterally. Motor strength is 5 out of 5 all muscle groups of the lower extremities. Deep tendon reflexes of the upper extremities are 2+ straight leg raising is negative .  [facility] reading (PRIMARY) by  Dr. Cleta Alberts         Assessment & Plan:

## 2012-07-18 NOTE — Telephone Encounter (Signed)
It appears she was seen by Dr. Cleta Alberts today.

## 2012-07-22 ENCOUNTER — Telehealth: Payer: Self-pay

## 2012-07-22 NOTE — Telephone Encounter (Signed)
MRI c-spine prior-authorization requires 3 consecutive weeks of supervised treatment.  Scan order cancelled. However, if there are objective findings of weakness, muscle atrophy or abnormal reflexes, can re-initiate request and do peer-to-peer.

## 2012-07-22 NOTE — Telephone Encounter (Signed)
Peer to Peer needed for MRI cervical w/o on/before 07/23/12. ID #[id number], phone # [phone], opt 2.

## 2012-07-23 NOTE — Telephone Encounter (Signed)
Called [facility] back and gave info regarding xray and MRI was still denied d/t not having at least 3 weeks of consecutive conservative treatment such as NSAIDS. Dr Cleta Alberts would like pt to be advised of this and have her return 3 weeks after 07/16/12 visit when she was started on the Mobic for another exam and MRI should be approved at that point. LMOM for CB.

## 2012-07-23 NOTE — Telephone Encounter (Signed)
She has advanced C5-C6 degenerative disc disease on her C-spine films. She has numbness in both of her hands. She should qualify to have an MRI. Her symptoms have been going on for over an year now.

## 2012-07-24 NOTE — Telephone Encounter (Signed)
Pt CB and I gave her message and inst's for f/up. Pt agreed and asked that her xrays from the past month be copied on CD for her for her chiropractor. Asked X-ray to copy and put in drawer.

## 2012-08-18 ENCOUNTER — Telehealth: Payer: Self-pay

## 2012-08-18 NOTE — Telephone Encounter (Signed)
PT STATES DR DAUB HAD PUT HER MODIFIED DUTY, SHE NOW NEED TO BE TAKEN OFF AND GO BACK FULL DUTY. WOULD LIKE [place] TO FAX A NOTE TO HER JOB STATING SHE CAN DO FULL DUTY NOW. PLEASE FAX TO [phone] ATTN: PAULA AND YOU MAY REACH PT AT [phone] IF NEEDED

## 2012-08-19 NOTE — Telephone Encounter (Signed)
Your note indicates light duty until follow up, but patient wants to be released to full duty is this okay?

## 2012-08-19 NOTE — Telephone Encounter (Signed)
No, MRI not authorized by insurance, we must have documentation of failure of 4 weeks of conservative treatment first.

## 2012-08-19 NOTE — Telephone Encounter (Signed)
Called her she is feeling better has gone to [place]. Advised her note faxed.

## 2012-08-19 NOTE — Telephone Encounter (Signed)
If she feels Ok we can let her go back to regular duty. I would still like to get her MRI after one month of treatment.Thank you

## 2012-08-19 NOTE — Telephone Encounter (Signed)
Did she ever have her neck MRI ? If she is back to baseline we can get her a note for regular duty.I need to see her next week if she is still having  symptoms

## 2012-08-28 ENCOUNTER — Other Ambulatory Visit: Payer: Self-pay | Admitting: Physician Assistant

## 2012-09-03 ENCOUNTER — Telehealth: Payer: Self-pay

## 2012-09-06 NOTE — Telephone Encounter (Signed)
ERROR

## 2012-09-28 ENCOUNTER — Telehealth: Payer: Self-pay

## 2012-09-28 NOTE — Telephone Encounter (Signed)
PT WOULD LIKE TO BE SWITCHED FROM ZOLOFT TO PAXIL SINCE SHE IS HAVING A LOT OF ANXIETY ISSUES. PLEASE CALL [phone]

## 2012-09-29 NOTE — Telephone Encounter (Signed)
Dr Cleta Alberts does she need office visit for medication change? Please advise

## 2012-09-30 ENCOUNTER — Ambulatory Visit (INDEPENDENT_AMBULATORY_CARE_PROVIDER_SITE_OTHER): Payer: BC Managed Care – PPO | Admitting: Family Medicine

## 2012-09-30 VITALS — BP 110/77 | HR 60 | Temp 98.8°F | Resp 18 | Wt 141.0 lb

## 2012-09-30 DIAGNOSIS — Z87312 Personal history of (healed) stress fracture: Secondary | ICD-10-CM

## 2012-09-30 DIAGNOSIS — Z8262 Family history of osteoporosis: Secondary | ICD-10-CM

## 2012-09-30 DIAGNOSIS — F411 Generalized anxiety disorder: Secondary | ICD-10-CM

## 2012-09-30 MED ORDER — PAROXETINE HCL ER 12.5 MG PO TB24: ORAL_TABLET | ORAL | Status: DC

## 2012-09-30 NOTE — Patient Instructions (Addendum)
Serotonin Syndrome  Serotonin is a brain chemical that regulates the nervous system. Some kinds of drugs increase the amount of serotonin in your body. Drugs that increase the serotonin in your body include:   · Anti-depressant medications.  · St. John's wort.  · Recreational drugs.  · Migraine medicines.  · Some pain medicines.  SYMPTOMS  Combining these drugs increases the risk that you will become ill with a toxic condition called serotonin syndrome.   Symptoms of too much serotonin include:  · Confusion.  · Agitation.  · Weakness.  · Insomnia.  · Fever.  · Sweats.  Other symptoms that may develop include:  · Shakiness.  · Muscle spasms.  · Seizures.  TREATMENT  · Hospital treatment is often needed until the effects are controlled.  · Avoiding the combination of medicines listed above is recommended.  · Check with your doctor if you are concerned about your medicine or the side effects.  Document Released: 03/07/2004 Document Revised: 04/22/2011 Document Reviewed: 01/28/2005  [employer]® Patient Information ©2014 [employer].

## 2012-09-30 NOTE — Progress Notes (Signed)
[facility]:  Office Visit  Chief Complaint:  Chief Complaint  Patient presents with  . Anxiety    wants to be switched from Zoloft to Paxil    HPI: Kimberly Mooney is a 42 y.o. female who complains of  Here for anxiety meds She has been on zoloft meds x 5 years She has anxiety and was put on Paxil and felt that it had better control of her anxiety She was switched to zoloft because she was thinking of getting pregnant She has a lot of anxiety right now, she is getting married in 1 1/2 month She works in hospice She just found out husband to be dx with Parkinson She has had mutiple stress fractures in her feet, is a runner Mom just got dx with osteopororis, she is 23 She has thyroid disease. Marland Kitchen     Past Medical History  Diagnosis Date  . GERD (gastroesophageal reflux disease)   . Thyroid disease     hypothyroidism  . Gallstones   . Depression   . Anxiety    Past Surgical History  Procedure Laterality Date  . Cholecystectomy     History   Social History  . Marital Status: Single    Spouse Name: N/A    Number of Children: N/A  . Years of Education: N/A   Social History Main Topics  . Smoking status: Former Games developer  . Smokeless tobacco: None     Comment: smoked occasionally in college  . Alcohol Use: Yes     Comment: social  . Drug Use: No  . Sexual Activity: Yes    Birth Control/ Protection: IUD   Other Topics Concern  . None   Social History Narrative  . None   Family History  Problem Relation Age of Onset  . Cancer Mother     breast  . Hypertension Mother   . Osteoporosis Mother   . Hypertension Father    Allergies  Allergen Reactions  . Amoxicillin Diarrhea  . Augmentin [Amoxicillin-Pot Clavulanate] Diarrhea  . Doxycycline   . Shellfish Allergy    Prior to Admission medications   Medication Sig Start Date End Date Taking? Authorizing Provider  lansoprazole (PREVACID) 15 MG capsule Take 15 mg by mouth daily.   Yes  Historical Provider, MD  levonorgestrel (MIRENA) 20 MCG/24HR IUD 1 each by Intrauterine route once.   Yes Historical Provider, MD  levothyroxine (SYNTHROID, LEVOTHROID) 137 MCG tablet Take 1 tablet (137 mcg total) by mouth daily. 07/18/12  Yes Collene Gobble, MD  sertraline (ZOLOFT) 25 MG tablet Take 1 tablet (25 mg total) by mouth daily. 07/18/12  Yes Collene Gobble, MD     ROS: The patient denies fevers, chills, night sweats, unintentional weight loss, chest pain, palpitations, wheezing, dyspnea on exertion, nausea, vomiting, abdominal pain, dysuria, hematuria, melena, numbness, weakness, or tingling.   All other systems have been reviewed and were otherwise negative with the exception of those mentioned in the HPI and as above.    PHYSICAL EXAM: Filed Vitals:   09/30/12 1353  BP: 110/77  Pulse: 60  Temp: 98.8 F (37.1 C)  Resp: 18   Filed Vitals:   09/30/12 1353  Weight: 141 lb (63.957 kg)   Body mass index is 22.42 kg/(m^2).  General: Alert, no acute distress HEENT:  Normocephalic, atraumatic, oropharynx patent. EOMI, PERRLA Cardiovascular:  Regular rate and rhythm, no rubs murmurs or gallops.  No Carotid bruits, radial pulse intact. No pedal edema.  Respiratory:  Clear to auscultation bilaterally.  No wheezes, rales, or rhonchi.  No cyanosis, no use of accessory musculature GI: No organomegaly, abdomen is soft and non-tender, positive bowel sounds.  No masses. Skin: No rashes. Neurologic: Facial musculature symmetric. Psychiatric: Patient is appropriate throughout our interaction. Lymphatic: No cervical lymphadenopathy Musculoskeletal: Gait intact.   LABS: Results for orders placed in visit on 07/16/12  TSH      Result Value Range   TSH 5.992 (*) 0.350 - 4.500 uIU/mL  VITAMIN B12      Result Value Range   Vitamin B-12 451  211 - 911 pg/mL  COMPREHENSIVE METABOLIC PANEL      Result Value Range   Sodium 139  135 - 145 mEq/L   Potassium 4.3  3.5 - 5.3 mEq/L   Chloride 103   96 - 112 mEq/L   CO2 27  19 - 32 mEq/L   Glucose, Bld 87  70 - 99 mg/dL   BUN 18  6 - 23 mg/dL   Creat 1.61  0.96 - 0.45 mg/dL   Total Bilirubin 0.3  0.3 - 1.2 mg/dL   Alkaline Phosphatase 54  39 - 117 U/L   AST 14  0 - 37 U/L   ALT 11  0 - 35 U/L   Total Protein 7.2  6.0 - 8.3 g/dL   Albumin 4.3  3.5 - 5.2 g/dL   Calcium 9.7  8.4 - 40.9 mg/dL  CK      Result Value Range   Total CK 50  7 - 177 U/L  POCT CBC      Result Value Range   WBC 8.7  4.6 - 10.2 K/uL   Lymph, poc 2.9  0.6 - 3.4   POC LYMPH PERCENT 33.0  10 - 50 %L   MID (cbc) 0.5  0 - 0.9   POC MID % 5.4  0 - 12 %M   POC Granulocyte 5.4  2 - 6.9   Granulocyte percent 61.6  37 - 80 %G   RBC 4.53  4.04 - 5.48 M/uL   Hemoglobin 13.5  12.2 - 16.2 g/dL   HCT, POC 81.1  91.4 - 47.9 %   MCV 95.3  80 - 97 fL   MCH, POC 29.8  27 - 31.2 pg   MCHC 31.3 (*) 31.8 - 35.4 g/dL   RDW, POC 78.2     Platelet Count, POC 215  142 - 424 K/uL   MPV 12.8  0 - 99.8 fL  POCT SEDIMENTATION RATE      Result Value Range   POCT SED RATE 12  0 - 22 mm/hr     EKG/XRAY:   Primary read interpreted by Dr. Conley Rolls at [facility].   ASSESSMENT/PLAN: Encounter Diagnoses  Name Primary?  . Generalized anxiety disorder Yes  . History of stress fracture   . Family history of osteoporosis    DC Zoloft Start Paxil Precautions given Will get DEXA scan due to h/o multiple  stress fracture F/u in 2 months after wedding and get TSH at same time Go to Er prn. Denies SI/HI/Hallucniations Gross sideeffects, risk and benefits, and alternatives of medications d/w patient. Patient is aware that all medications have potential sideeffects and we are unable to predict every sideeffect or drug-drug interaction that may occur.  Hamilton Capri PHUONG, DO 09/30/2012 2:58 PM

## 2012-09-30 NOTE — Telephone Encounter (Signed)
Patient has been seen today and medication switched.

## 2012-10-26 ENCOUNTER — Ambulatory Visit (INDEPENDENT_AMBULATORY_CARE_PROVIDER_SITE_OTHER): Payer: BC Managed Care – PPO | Admitting: Emergency Medicine

## 2012-10-26 ENCOUNTER — Encounter: Payer: Self-pay | Admitting: Emergency Medicine

## 2012-10-26 ENCOUNTER — Ambulatory Visit: Payer: No Typology Code available for payment source

## 2012-10-26 VITALS — BP 122/88 | HR 76 | Temp 98.1°F | Resp 16 | Ht 66.5 in | Wt 143.6 lb

## 2012-10-26 DIAGNOSIS — M549 Dorsalgia, unspecified: Secondary | ICD-10-CM

## 2012-10-26 DIAGNOSIS — Z23 Encounter for immunization: Secondary | ICD-10-CM

## 2012-10-26 DIAGNOSIS — F411 Generalized anxiety disorder: Secondary | ICD-10-CM

## 2012-10-26 DIAGNOSIS — M5010 Cervical disc disorder with radiculopathy, unspecified cervical region: Secondary | ICD-10-CM

## 2012-10-26 DIAGNOSIS — R209 Unspecified disturbances of skin sensation: Secondary | ICD-10-CM

## 2012-10-26 DIAGNOSIS — M4120 Other idiopathic scoliosis, site unspecified: Secondary | ICD-10-CM

## 2012-10-26 DIAGNOSIS — R200 Anesthesia of skin: Secondary | ICD-10-CM

## 2012-10-26 DIAGNOSIS — M5412 Radiculopathy, cervical region: Secondary | ICD-10-CM

## 2012-10-26 MED ORDER — PAROXETINE HCL ER 12.5 MG PO TB24: ORAL_TABLET | ORAL | Status: DC

## 2012-10-26 NOTE — Progress Notes (Signed)
  Subjective:    Patient ID: Kimberly Mooney, female    DOB: 1970-08-06, 42 y.o.   MRN: [id number]  HPI patient continues to have pain and discomfort in her neck and mid back. She has been seeing a chiropractor regular and receiving treatments but she has not improved with treatment. She was initially ordered to have an MRI the C-spine but this was declined because she had not failed 6 weeks of conservative treatment yet. Now she is failed 6 weeks of conservative treatment    Review of Systems     Objective:   Physical Exam there is tenderness over the C-spine. Deep tendon reflexes of the upper trimmings are 2+. Motor strength is 5 out of 5 previous films of the C-spine show severe advanced C5-6 degenerative disc disease  [facility] reading (PRIMARY) by  Dr. Cleta Alberts there is a scoliotic curve upper thoracic spine no bony lesions are seen        Assessment & Plan:  We'll proceed with MRI of the C-spine and thoracic spine

## 2012-11-08 ENCOUNTER — Ambulatory Visit
Admission: RE | Admit: 2012-11-08 | Discharge: 2012-11-08 | Disposition: A | Discharge status: Home / Self Care | Payer: No Typology Code available for payment source | Source: Ambulatory Visit | Attending: Emergency Medicine | Admitting: Emergency Medicine

## 2012-11-08 DIAGNOSIS — M4120 Other idiopathic scoliosis, site unspecified: Secondary | ICD-10-CM

## 2012-11-08 DIAGNOSIS — M549 Dorsalgia, unspecified: Secondary | ICD-10-CM

## 2012-12-06 ENCOUNTER — Encounter: Payer: Self-pay | Admitting: Emergency Medicine

## 2012-12-07 ENCOUNTER — Other Ambulatory Visit: Payer: Self-pay | Admitting: Emergency Medicine

## 2012-12-07 DIAGNOSIS — F329 Major depressive disorder, single episode, unspecified: Secondary | ICD-10-CM

## 2012-12-07 MED ORDER — PAROXETINE HCL 10 MG PO TABS: 10.0000 mg | ORAL_TABLET | ORAL | Status: DC

## 2012-12-14 ENCOUNTER — Other Ambulatory Visit: Payer: Self-pay

## 2012-12-14 DIAGNOSIS — Z1231 Encounter for screening mammogram for malignant neoplasm of breast: Secondary | ICD-10-CM

## 2013-01-19 ENCOUNTER — Ambulatory Visit
Admission: RE | Admit: 2013-01-19 | Discharge: 2013-01-19 | Disposition: A | Discharge status: Home / Self Care | Payer: No Typology Code available for payment source | Source: Ambulatory Visit

## 2013-01-19 DIAGNOSIS — Z1231 Encounter for screening mammogram for malignant neoplasm of breast: Secondary | ICD-10-CM

## 2013-05-19 ENCOUNTER — Other Ambulatory Visit: Payer: Self-pay | Admitting: Emergency Medicine

## 2013-05-19 ENCOUNTER — Encounter: Payer: Self-pay | Admitting: Emergency Medicine

## 2013-05-19 DIAGNOSIS — N644 Mastodynia: Secondary | ICD-10-CM

## 2013-06-03 ENCOUNTER — Encounter: Payer: Self-pay | Admitting: Emergency Medicine

## 2013-06-14 ENCOUNTER — Other Ambulatory Visit: Payer: Self-pay | Admitting: Dermatology

## 2013-07-06 ENCOUNTER — Encounter: Payer: Self-pay | Admitting: Emergency Medicine

## 2013-07-10 ENCOUNTER — Ambulatory Visit (INDEPENDENT_AMBULATORY_CARE_PROVIDER_SITE_OTHER): Payer: No Typology Code available for payment source | Admitting: Emergency Medicine

## 2013-07-10 VITALS — BP 118/60 | HR 74 | Temp 98.5°F | Ht 66.5 in | Wt 153.2 lb

## 2013-07-10 DIAGNOSIS — E041 Nontoxic single thyroid nodule: Secondary | ICD-10-CM

## 2013-07-10 DIAGNOSIS — Z803 Family history of malignant neoplasm of breast: Secondary | ICD-10-CM

## 2013-07-10 DIAGNOSIS — R928 Other abnormal and inconclusive findings on diagnostic imaging of breast: Secondary | ICD-10-CM

## 2013-07-10 DIAGNOSIS — E039 Hypothyroidism, unspecified: Secondary | ICD-10-CM

## 2013-07-10 DIAGNOSIS — N644 Mastodynia: Secondary | ICD-10-CM

## 2013-07-10 LAB — TSH: TSH: 2.906 u[IU]/mL (ref 0.350–4.500)

## 2013-07-10 LAB — T4, FREE: Free T4: 1.56 ng/dL (ref 0.80–1.80)

## 2013-07-10 MED ORDER — LEVOTHYROXINE SODIUM 137 MCG PO TABS: 137.0000 ug | ORAL_TABLET | Freq: Every day | ORAL | Status: DC

## 2013-07-10 NOTE — Progress Notes (Addendum)
Subjective:    Patient ID: Kimberly Mooney, female    DOB: 02/23/1970, 43 y.o.   MRN: [id number]  Shoulder Pain  Pertinent negatives include no numbness.   Chief Complaint  Patient presents with  . Breast Pain    left breast pain x 3months  . Shoulder Pain    left shoulder pain x 6 months   This chart was scribed for Lesle ChrisSteven Cayle Cordoba, MD by Andrew Auaven Small, ED Scribe. This patient was seen in room 3 and the patient's care was started at 8:25 AM.  HPI Comments: Kimberly Mooney is a 43 y.o. female who presents to the [facility] complaining of left breast pain. Pt reports pain with touch. She denies discharge fromnipple. Pt denies she or her gynecologist feeling lumps in breast. Pt last mammogram in 6 months ago. pt has famlty h/o of breast cancer and reports her mother was diagnosed with with breast cancer at 9670. Pt denies genetic testing.   Pt reports left shoulder pain onset 6 months. She reports pain with push-ups. Pt reports she hears "pops" and "clicks" in her shoulder with certain movements. Pt denies pain with moving her neck.  Pt last sonogram was June 2012 which showed  increased echogenicity and suggested thyroiditis   Past Medical History  Diagnosis Date  . GERD (gastroesophageal reflux disease)   . Thyroid disease     hypothyroidism  . Gallstones   . Depression   . Anxiety    Allergies  Allergen Reactions  . Amoxicillin Diarrhea  . Augmentin [Amoxicillin-Pot Clavulanate] Diarrhea  . Doxycycline   . Shellfish Allergy    Prior to Admission medications   Medication Sig Start Date End Date Taking? Authorizing Provider  lansoprazole (PREVACID) 15 MG capsule Take 15 mg by mouth daily.   Yes Historical Provider, MD  levonorgestrel (MIRENA) 20 MCG/24HR IUD 1 each by Intrauterine route once.   Yes Historical Provider, MD  levothyroxine (SYNTHROID, LEVOTHROID) 137 MCG tablet Take 1 tablet (137 mcg total) by mouth daily. 07/18/12  Yes Collene GobbleSteven A Vanecia Limpert, MD    PARoxetine (PAXIL) 10 MG tablet Take 1 tablet (10 mg total) by mouth every morning. 12/07/12  Yes Collene GobbleSteven A Skylor Hughson, MD    Review of Systems  Musculoskeletal: Positive for myalgias.  Neurological: Negative for weakness and numbness.       Objective:   Physical Exam CONSTITUTIONAL: Well developed/well nourished HEAD: Normocephalic/atraumatic EYES: EOMI/PERRL ENMT: Mucous membranes moist NECK: supple no meningeal signs, 1x1 right thyroid nodule  SPINE:entire spine nontender CV: S1/S2 noted, no murmurs/rubs/gallops noted LUNGS: Lungs are clear to auscultation bilaterally, no apparent distress ABDOMEN: soft, nontender, no rebound or guarding GU:no cva tenderness, tenderness in upper outer quadrant of the  left breast without definite mass.  NEURO: Pt is awake/alert, moves all extremitiesx4 EXTREMITIES: pulses normal, full ROM, there is pop felt with extension and abduction of the left shoulder. No weakness noted  SKIN: warm, color normal PSYCH: no abnormalities of mood noted    Assessment & Plan:   the we'll proceed with ultrasound of the thyroid. Last ultrasound was done 3 years ago and showed no specific nodules. We'll also see if we can get approval for a left breast MRI. There is a good chance she will also need left shoulder MRI and or referral to ortho I personally performed the services described in this documentation, which was scribed in my presence. The recorded information has been reviewed and is accurate. We will address her left  shoulder popping and discomfort after she completes her thyroid and breast evaluation

## 2013-07-13 ENCOUNTER — Ambulatory Visit
Admission: RE | Admit: 2013-07-13 | Discharge: 2013-07-13 | Disposition: A | Discharge status: Home / Self Care | Payer: No Typology Code available for payment source | Source: Ambulatory Visit | Attending: Emergency Medicine | Admitting: Emergency Medicine

## 2013-07-13 DIAGNOSIS — E041 Nontoxic single thyroid nodule: Secondary | ICD-10-CM

## 2013-07-15 ENCOUNTER — Other Ambulatory Visit: Payer: Self-pay | Admitting: Radiology

## 2013-07-15 DIAGNOSIS — R928 Other abnormal and inconclusive findings on diagnostic imaging of breast: Secondary | ICD-10-CM

## 2013-07-26 ENCOUNTER — Ambulatory Visit
Admission: RE | Admit: 2013-07-26 | Discharge: 2013-07-26 | Disposition: A | Discharge status: Home / Self Care | Payer: No Typology Code available for payment source | Source: Ambulatory Visit | Attending: Emergency Medicine | Admitting: Emergency Medicine

## 2013-07-26 ENCOUNTER — Encounter: Payer: Self-pay | Admitting: Emergency Medicine

## 2013-07-26 DIAGNOSIS — R928 Other abnormal and inconclusive findings on diagnostic imaging of breast: Secondary | ICD-10-CM

## 2013-07-26 MED ORDER — GADOBENATE DIMEGLUMINE 529 MG/ML IV SOLN
13.0000 mL | Freq: Once | INTRAVENOUS | Status: AC | PRN
  Administered 2013-07-26: 13 mL via INTRAVENOUS

## 2013-07-27 ENCOUNTER — Other Ambulatory Visit: Payer: Self-pay | Admitting: Emergency Medicine

## 2013-07-27 DIAGNOSIS — M25519 Pain in unspecified shoulder: Secondary | ICD-10-CM

## 2013-08-24 ENCOUNTER — Ambulatory Visit (INDEPENDENT_AMBULATORY_CARE_PROVIDER_SITE_OTHER): Payer: No Typology Code available for payment source

## 2013-08-24 ENCOUNTER — Ambulatory Visit (INDEPENDENT_AMBULATORY_CARE_PROVIDER_SITE_OTHER): Payer: No Typology Code available for payment source | Admitting: Family Medicine

## 2013-08-24 VITALS — BP 112/68 | HR 75 | Temp 97.9°F | Resp 16 | Ht 67.25 in | Wt 152.2 lb

## 2013-08-24 DIAGNOSIS — R059 Cough, unspecified: Secondary | ICD-10-CM

## 2013-08-24 DIAGNOSIS — R079 Chest pain, unspecified: Secondary | ICD-10-CM

## 2013-08-24 DIAGNOSIS — R05 Cough: Secondary | ICD-10-CM

## 2013-08-24 DIAGNOSIS — R002 Palpitations: Secondary | ICD-10-CM

## 2013-08-24 DIAGNOSIS — R9431 Abnormal electrocardiogram [ECG] [EKG]: Secondary | ICD-10-CM

## 2013-08-24 MED ORDER — FLUTICASONE PROPIONATE 50 MCG/ACT NA SUSP: 1.0000 | Freq: Every day | NASAL | Status: DC

## 2013-08-24 NOTE — Patient Instructions (Addendum)
Continue prevacid 15mg  - 1 to 2 each day, and avoid common heartburn triggers below.  Try flonase nasal spray in the evening, and claritin once per day.  If cough not improved in next few weeks - return to discuss other possible causes.   If chest soreness not improving with relative rest/decrease in upper body exercise over next week or two, recommend cardiology eval. Let me know and I can set this up for you.   Return to the clinic or go to the nearest emergency room if any of your symptoms worsen or new symptoms occur.  I sent a message to Dr. Cleta Alberts, but may need to walk in to see him for physical before Oct 1st.      Food Choices for Gastroesophageal Reflux Disease When you have gastroesophageal reflux disease (GERD), the foods you eat and your eating habits are very important. Choosing the right foods can help ease the discomfort of GERD. WHAT GENERAL GUIDELINES DO I NEED TO FOLLOW?  Choose fruits, vegetables, whole grains, low-fat dairy products, and low-fat meat, fish, and poultry.  Limit fats such as oils, salad dressings, butter, nuts, and avocado.  Keep a food diary to identify foods that cause symptoms.  Avoid foods that cause reflux. These may be different for different people.  Eat frequent small meals instead of three large meals each day.  Eat your meals slowly, in a relaxed setting.  Limit fried foods.  Cook foods using methods other than frying.  Avoid drinking alcohol.  Avoid drinking large amounts of liquids with your meals.  Avoid bending over or lying down until 2-3 hours after eating. WHAT FOODS ARE NOT RECOMMENDED? The following are some foods and drinks that may worsen your symptoms: Vegetables Tomatoes. Tomato juice. Tomato and spaghetti sauce. Chili peppers. Onion and garlic. Horseradish. Fruits Oranges, grapefruit, and lemon (fruit and juice). Meats High-fat meats, fish, and poultry. This includes hot dogs, ribs, ham, sausage, salami, and  bacon. Dairy Whole milk and chocolate milk. Sour cream. Cream. Butter. Ice cream. Cream cheese.  Beverages Coffee and tea, with or without caffeine. Carbonated beverages or energy drinks. Condiments Hot sauce. Barbecue sauce.  Sweets/Desserts Chocolate and cocoa. Donuts. Peppermint and spearmint. Fats and Oils High-fat foods, including [place] fries and potato chips. Other Vinegar. Strong spices, such as black pepper, white pepper, red pepper, cayenne, curry powder, cloves, ginger, and chili powder. The items listed above may not be a complete list of foods and beverages to avoid. Contact your dietitian for more information. Document Released: 01/28/2005 Document Revised: 02/02/2013 Document Reviewed: 12/02/2012 [facility] Patient Information 2015 [place], [place]. This information is not intended to replace advice given to you by your health care provider. Make sure you discuss any questions you have with your health care provider.   Chest Pain (Nonspecific) It is often hard to give a specific diagnosis for the cause of chest pain. There is always a chance that your pain could be related to something serious, such as a heart attack or a blood clot in the lungs. You need to follow up with your health care provider for further evaluation. CAUSES   Heartburn.  Pneumonia or bronchitis.  Anxiety or stress.  Inflammation around your heart (pericarditis) or lung (pleuritis or pleurisy).  A blood clot in the lung.  A collapsed lung (pneumothorax). It can develop suddenly on its own (spontaneous pneumothorax) or from trauma to the chest.  Shingles infection (herpes zoster virus). The chest wall is composed of bones, muscles, and cartilage.  Any of these can be the source of the pain.  The bones can be bruised by injury.  The muscles or cartilage can be strained by coughing or overwork.  The cartilage can be affected by inflammation and become sore (costochondritis). DIAGNOSIS  Lab tests  or other studies may be needed to find the cause of your pain. Your health care provider may have you take a test called an ambulatory electrocardiogram (ECG). An ECG records your heartbeat patterns over a 24-hour period. You may also have other tests, such as:  Transthoracic echocardiogram (TTE). During echocardiography, sound waves are used to evaluate how blood flows through your heart.  Transesophageal echocardiogram (TEE).  Cardiac monitoring. This allows your health care provider to monitor your heart rate and rhythm in real time.  Holter monitor. This is a portable device that records your heartbeat and can help diagnose heart arrhythmias. It allows your health care provider to track your heart activity for several days, if needed.  Stress tests by exercise or by giving medicine that makes the heart beat faster. TREATMENT   Treatment depends on what may be causing your chest pain. Treatment may include:  Acid blockers for heartburn.  Anti-inflammatory medicine.  Pain medicine for inflammatory conditions.  Antibiotics if an infection is present.  You may be advised to change lifestyle habits. This includes stopping smoking and avoiding alcohol, caffeine, and chocolate.  You may be advised to keep your head raised (elevated) when sleeping. This reduces the chance of acid going backward from your stomach into your esophagus. Most of the time, nonspecific chest pain will improve within 2-3 days with rest and mild pain medicine.  HOME CARE INSTRUCTIONS   If antibiotics were prescribed, take them as directed. Finish them even if you start to feel better.  For the next few days, avoid physical activities that bring on chest pain. Continue physical activities as directed.  Do not use any tobacco products, including cigarettes, chewing tobacco, or electronic cigarettes.  Avoid drinking alcohol.  Only take medicine as directed by your health care provider.  Follow your health care  provider's suggestions for further testing if your chest pain does not go away.  Keep any follow-up appointments you made. If you do not go to an appointment, you could develop lasting (chronic) problems with pain. If there is any problem keeping an appointment, call to reschedule. SEEK MEDICAL CARE IF:   Your chest pain does not go away, even after treatment.  You have a rash with blisters on your chest.  You have a fever. SEEK IMMEDIATE MEDICAL CARE IF:   You have increased chest pain or pain that spreads to your arm, neck, jaw, back, or abdomen.  You have shortness of breath.  You have an increasing cough, or you cough up blood.  You have severe back or abdominal pain.  You feel nauseous or vomit.  You have severe weakness.  You faint.  You have chills. This is an emergency. Do not wait to see if the pain will go away. Get medical help at once. Call your local emergency services (911 in [place] Do not drive yourself to the hospital. MAKE SURE YOU:   Understand these instructions.  Will watch your condition.  Will get help right away if you are not doing well or get worse. Document Released: 11/07/2004 Document Revised: 02/02/2013 Document Reviewed: 09/03/2007 [facility] Patient Information 2015 [place], [place]. This information is not intended to replace advice given to you by your health care provider.  Make sure you discuss any questions you have with your health care provider.   Cough, Adult  A cough is a reflex that helps clear your throat and airways. It can help heal the body or may be a reaction to an irritated airway. A cough may only last 2 or 3 weeks (acute) or may last more than 8 weeks (chronic).  CAUSES Acute cough:  Viral or bacterial infections. Chronic cough:  Infections.  Allergies.  Asthma.  Post-nasal drip.  Smoking.  Heartburn or acid reflux.  Some medicines.  Chronic lung problems (COPD).  Cancer. SYMPTOMS    Cough.  Fever.  Chest pain.  Increased breathing rate.  High-pitched whistling sound when breathing (wheezing).  Colored mucus that you cough up (sputum). TREATMENT   A bacterial cough may be treated with antibiotic medicine.  A viral cough must run its course and will not respond to antibiotics.  Your caregiver may recommend other treatments if you have a chronic cough. HOME CARE INSTRUCTIONS   Only take over-the-counter or prescription medicines for pain, discomfort, or fever as directed by your caregiver. Use cough suppressants only as directed by your caregiver.  Use a cold steam vaporizer or humidifier in your bedroom or home to help loosen secretions.  Sleep in a semi-upright position if your cough is worse at night.  Rest as needed.  Stop smoking if you smoke. SEEK IMMEDIATE MEDICAL CARE IF:   You have pus in your sputum.  Your cough starts to worsen.  You cannot control your cough with suppressants and are losing sleep.  You begin coughing up blood.  You have difficulty breathing.  You develop pain which is getting worse or is uncontrolled with medicine.  You have a fever. MAKE SURE YOU:   Understand these instructions.  Will watch your condition.  Will get help right away if you are not doing well or get worse. Document Released: 07/27/2010 Document Revised: 04/22/2011 Document Reviewed: 07/27/2010 [facility] Patient Information 2015 [place], [place]. This information is not intended to replace advice given to you by your health care provider. Make sure you discuss any questions you have with your health care provider.

## 2013-08-24 NOTE — Progress Notes (Addendum)
Subjective:  This chart was scribed for Kimberly Edwards, MD, by Kimberly Mooney, Medical Scribe. This  patient's care was started at 1:41 PM. Authored by [name], MD - unable to change in [employer]    Patient ID: Kimberly Mooney, female    DOB: [birth year], 43 y.o.   MRN: [id number]  Chief Complaint  Patient presents with   Annual Exam    no pap   Cough    coughing for the past few months, dry coughing   EKG    Had a previous EKG done, was abnormal, wants another one done    HPI  Kimberly Mooney is a 43 y.o. female PCP: DAUB, STEVE A, MD  The pt is a primary pt of Dr. Cleta Alberts here requesting a physical but also other concerns of a cough and previous abnormal EKG.   1. Cough: The last chest x-ray performed here was two years ago in October 2013 showing minimal biapical pleural parenchymal scarring, but lungs were clear. No acute process.  In the office, [name] reports the cough is dry, has persisted for several months, and worsens at night when the pt is prone. The cough interrupts both her and her boyfriend's sleep.  She has not treated the cough with any OTC medication though she uses Benadryl at night as a sleep aid. She denies current allergies. She also denies a post-nasal drip, fever, SOB, or unexpected weight loss.  She endorses night sweats, but she attributes them to Paxil. The pt has a h/o bronchitis and one episode of pneumonia, but she denies experiencing either chronically. The pt also reports a h/o GERD for which she takes Prevacid 15 mg once a day; she states the Prevacid is successful at suppressing the symptoms. She denies chronic use of spearmint gum or spicy foods. She reports she puts Cayenne pepper in her coffee occasionally; she drinks 1-2 cups of coffee a day. She states she has decreased the amount because she was experiencing breast pain; with  decreased coffee intake her breast pain has improved.    2. Abnormal EKG: I saw pt a year ago on July 16, 2012  when she was having palpitations. She had a borderline early shortened PR interval at 118. TSH last checked May 30 was normal at 2.9, but was borderline elevated at 5.99 at that visit with me.  In the office, [name] endorses palpitations occurring once a week. She reports improvement as she used to experience palpitations daily. She characterizes the palpitations as "skipped beats" which resolve spontaneously after a few seconds. She also endorses intermittent upper left chest pain which is not reproducible with movement and which she attributes to exercise.   The pt is not fasting today.   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  Patient Active Problem List   Diagnosis Date Noted   Thyroid nodule 07/10/2013   Chronic cholecystitis with calculus 12/30/2011   PLEVA (pityriasis lichenoides et varioliformis acuta) 12/10/2011   Past Medical History  Diagnosis Date   GERD (gastroesophageal reflux disease)    Thyroid disease     hypothyroidism   Gallstones    Depression    Anxiety    Past Surgical History  Procedure Laterality Date   Cholecystectomy     Allergies  Allergen Reactions   Amoxicillin Diarrhea   Augmentin [Amoxicillin-Pot Clavulanate] Diarrhea   Doxycycline    Shellfish Allergy    Prior to Admission medications   Medication Sig Start Date End Date Taking? Authorizing Provider  lansoprazole (PREVACID) 15 MG capsule Take 15 mg by mouth daily.   Yes Historical Provider, MD  levonorgestrel (MIRENA) 20 MCG/24HR IUD 1 each by Intrauterine route once.   Yes Historical Provider, MD  levothyroxine (SYNTHROID, LEVOTHROID) 137 MCG tablet Take 1 tablet (137 mcg total) by mouth daily. 07/10/13  Yes Collene GobbleSteven A Daub, MD  PARoxetine (PAXIL) 10 MG tablet Take 1 tablet (10 mg total) by mouth every morning. 12/07/12  Yes Collene GobbleSteven A Daub, MD    Review of Systems  Constitutional: Positive for diaphoresis. Negative for fever and unexpected weight change.  HENT:  Negative for congestion and postnasal drip.   Respiratory: Positive for cough. Negative for shortness of breath.   Cardiovascular: Positive for chest pain and palpitations.  Allergic/Immunologic: Negative for environmental allergies.       Objective:   Physical Exam  Nursing note and vitals reviewed. Constitutional: She is oriented to person, place, and time. She appears well-developed and well-nourished. No distress.  HENT:  Head: Normocephalic and atraumatic.  Eyes: Conjunctivae and EOM are normal.  Neck: Neck supple. No tracheal deviation present.  Cardiovascular: Normal rate, regular rhythm and normal heart sounds.  Exam reveals no friction rub.   No murmur heard. Pulmonary/Chest: Effort normal and breath sounds normal. No respiratory distress. She has no wheezes. She has no rales.  Musculoskeletal: Normal range of motion.  Neurological: She is alert and oriented to person, place, and time.  Skin: Skin is warm and dry.  Psychiatric: She has a normal mood and affect. Her behavior is normal.   EKG: sinus rhythm PR interval is 126 no acute findings   Filed Vitals:   08/24/13 1226  BP: 112/68  Pulse: 75  Temp: 97.9 F (36.6 C)  TempSrc: Oral  Resp: 16  Height: 5' 7.25" (1.708 m)  Weight: 152 lb 3.2 oz (69.037 kg)  SpO2: 99%    Visual Acuity Screening   Right eye Left eye Both eyes  Without correction: 20/15 20/15 20/15   With correction:      [facility] reading (PRIMARY) by  Dr. Neva SeatGreene: CXR - NAD, no acute findings.      Assessment & Plan:   [name] is a 43 y.o. female Abnormal EKG - Plan: EKG 12-Lead, chest pain.   -abnormality on EKG resolved.  Normal PR interval at present. Recommended cardiology eval if chest soreness persists, but may be MSK source.  Declined referral at this point.   Cough - Plan: DG Chest 2 View, fluticasone (FLONASE) 50 MCG/ACT nasal spray  -possible LPR with underlying GERD vs. PND with mild allergic rhinitis.   -cont PPI qd - 15-30mg   prevacid, start flonase ns and claritin otc.  -if cough not improved in few weeks - recheck d  Palpitations  -rare. Most recent tsh in May ok. ekg ok.  If persists - return to discuss further or eval with cardiology as above. Rtc/er precautions.     Health maintenance - will return for scheduled physical with her PCP - Dr. Cleta Albertsaub.   Meds ordered this encounter  Medications   diphenhydrAMINE (BENADRYL) 25 MG tablet    Sig: Take 25 mg by mouth every 8 (eight) hours as needed for sleep.   fluticasone (FLONASE) 50 MCG/ACT nasal spray    Sig: Place 1-2 sprays into both nostrils daily.    Dispense:  16 g    Refill:  3   Patient Instructions  Continue prevacid 15mg  - 1 to 2 each day, and avoid common heartburn triggers below.  Try flonase nasal spray in the evening, and claritin once per day.  If cough not improved in next few weeks - return to discuss other possible causes.   If chest soreness not improving with relative rest/decrease in upper body exercise over next week or two, recommend cardiology eval. Let me know and I can set this up for you.   Return to the clinic or go to the nearest emergency room if any of your symptoms worsen or new symptoms occur.  I sent a message to Dr. Cleta Alberts, but may need to walk in to see him for physical before Oct 1st.      Food Choices for Gastroesophageal Reflux Disease When you have gastroesophageal reflux disease (GERD), the foods you eat and your eating habits are very important. Choosing the right foods can help ease the discomfort of GERD. WHAT GENERAL GUIDELINES DO I NEED TO FOLLOW?  Choose fruits, vegetables, whole grains, low-fat dairy products, and low-fat meat, fish, and poultry.  Limit fats such as oils, salad dressings, butter, nuts, and avocado.  Keep a food diary to identify foods that cause symptoms.  Avoid foods that cause reflux. These may be different for different people.  Eat frequent small meals instead of three large meals  each day.  Eat your meals slowly, in a relaxed setting.  Limit fried foods.  Cook foods using methods other than frying.  Avoid drinking alcohol.  Avoid drinking large amounts of liquids with your meals.  Avoid bending over or lying down until 2-3 hours after eating. WHAT FOODS ARE NOT RECOMMENDED? The following are some foods and drinks that may worsen your symptoms: Vegetables Tomatoes. Tomato juice. Tomato and spaghetti sauce. Chili peppers. Onion and garlic. Horseradish. Fruits Oranges, grapefruit, and lemon (fruit and juice). Meats High-fat meats, fish, and poultry. This includes hot dogs, ribs, ham, sausage, salami, and bacon. Dairy Whole milk and chocolate milk. Sour cream. Cream. Butter. Ice cream. Cream cheese.  Beverages Coffee and tea, with or without caffeine. Carbonated beverages or energy drinks. Condiments Hot sauce. Barbecue sauce.  Sweets/Desserts Chocolate and cocoa. Donuts. Peppermint and spearmint. Fats and Oils High-fat foods, including [place] fries and potato chips. Other Vinegar. Strong spices, such as black pepper, white pepper, red pepper, cayenne, curry powder, cloves, ginger, and chili powder. The items listed above may not be a complete list of foods and beverages to avoid. Contact your dietitian for more information. Document Released: 01/28/2005 Document Revised: 02/02/2013 Document Reviewed: 12/02/2012 [facility] Patient Information 2015 [place], [place]. This information is not intended to replace advice given to you by your health care provider. Make sure you discuss any questions you have with your health care provider.   Chest Pain (Nonspecific) It is often hard to give a specific diagnosis for the cause of chest pain. There is always a chance that your pain could be related to something serious, such as a heart attack or a blood clot in the lungs. You need to follow up with your health care provider for further evaluation. CAUSES    Heartburn.  Pneumonia or bronchitis.  Anxiety or stress.  Inflammation around your heart (pericarditis) or lung (pleuritis or pleurisy).  A blood clot in the lung.  A collapsed lung (pneumothorax). It can develop suddenly on its own (spontaneous pneumothorax) or from trauma to the chest.  Shingles infection (herpes zoster virus). The chest wall is composed of bones, muscles, and cartilage. Any of these can be the source of the pain.  The bones can be  bruised by injury.  The muscles or cartilage can be strained by coughing or overwork.  The cartilage can be affected by inflammation and become sore (costochondritis). DIAGNOSIS  Lab tests or other studies may be needed to find the cause of your pain. Your health care provider may have you take a test called an ambulatory electrocardiogram (ECG). An ECG records your heartbeat patterns over a 24-hour period. You may also have other tests, such as:  Transthoracic echocardiogram (TTE). During echocardiography, sound waves are used to evaluate how blood flows through your heart.  Transesophageal echocardiogram (TEE).  Cardiac monitoring. This allows your health care provider to monitor your heart rate and rhythm in real time.  Holter monitor. This is a portable device that records your heartbeat and can help diagnose heart arrhythmias. It allows your health care provider to track your heart activity for several days, if needed.  Stress tests by exercise or by giving medicine that makes the heart beat faster. TREATMENT   Treatment depends on what may be causing your chest pain. Treatment may include:  Acid blockers for heartburn.  Anti-inflammatory medicine.  Pain medicine for inflammatory conditions.  Antibiotics if an infection is present.  You may be advised to change lifestyle habits. This includes stopping smoking and avoiding alcohol, caffeine, and chocolate.  You may be advised to keep your head raised (elevated) when  sleeping. This reduces the chance of acid going backward from your stomach into your esophagus. Most of the time, nonspecific chest pain will improve within 2-3 days with rest and mild pain medicine.  HOME CARE INSTRUCTIONS   If antibiotics were prescribed, take them as directed. Finish them even if you start to feel better.  For the next few days, avoid physical activities that bring on chest pain. Continue physical activities as directed.  Do not use any tobacco products, including cigarettes, chewing tobacco, or electronic cigarettes.  Avoid drinking alcohol.  Only take medicine as directed by your health care provider.  Follow your health care provider's suggestions for further testing if your chest pain does not go away.  Keep any follow-up appointments you made. If you do not go to an appointment, you could develop lasting (chronic) problems with pain. If there is any problem keeping an appointment, call to reschedule. SEEK MEDICAL CARE IF:   Your chest pain does not go away, even after treatment.  You have a rash with blisters on your chest.  You have a fever. SEEK IMMEDIATE MEDICAL CARE IF:   You have increased chest pain or pain that spreads to your arm, neck, jaw, back, or abdomen.  You have shortness of breath.  You have an increasing cough, or you cough up blood.  You have severe back or abdominal pain.  You feel nauseous or vomit.  You have severe weakness.  You faint.  You have chills. This is an emergency. Do not wait to see if the pain will go away. Get medical help at once. Call your local emergency services (911 in [place] Do not drive yourself to the hospital. MAKE SURE YOU:   Understand these instructions.  Will watch your condition.  Will get help right away if you are not doing well or get worse. Document Released: 11/07/2004 Document Revised: 02/02/2013 Document Reviewed: 09/03/2007 [facility] Patient Information 2015 [place], [place]. This  information is not intended to replace advice given to you by your health care provider. Make sure you discuss any questions you have with your health care provider.  Cough, Adult  A cough is a reflex that helps clear your throat and airways. It can help heal the body or may be a reaction to an irritated airway. A cough may only last 2 or 3 weeks (acute) or may last more than 8 weeks (chronic).  CAUSES Acute cough:  Viral or bacterial infections. Chronic cough:  Infections.  Allergies.  Asthma.  Post-nasal drip.  Smoking.  Heartburn or acid reflux.  Some medicines.  Chronic lung problems (COPD).  Cancer. SYMPTOMS   Cough.  Fever.  Chest pain.  Increased breathing rate.  High-pitched whistling sound when breathing (wheezing).  Colored mucus that you cough up (sputum). TREATMENT   A bacterial cough may be treated with antibiotic medicine.  A viral cough must run its course and will not respond to antibiotics.  Your caregiver may recommend other treatments if you have a chronic cough. HOME CARE INSTRUCTIONS   Only take over-the-counter or prescription medicines for pain, discomfort, or fever as directed by your caregiver. Use cough suppressants only as directed by your caregiver.  Use a cold steam vaporizer or humidifier in your bedroom or home to help loosen secretions.  Sleep in a semi-upright position if your cough is worse at night.  Rest as needed.  Stop smoking if you smoke. SEEK IMMEDIATE MEDICAL CARE IF:   You have pus in your sputum.  Your cough starts to worsen.  You cannot control your cough with suppressants and are losing sleep.  You begin coughing up blood.  You have difficulty breathing.  You develop pain which is getting worse or is uncontrolled with medicine.  You have a fever. MAKE SURE YOU:   Understand these instructions.  Will watch your condition.  Will get help right away if you are not doing well or get  worse. Document Released: 07/27/2010 Document Revised: 04/22/2011 Document Reviewed: 07/27/2010 [facility] Patient Information 2015 [place], [place]. This information is not intended to replace advice given to you by your health care provider. Make sure you discuss any questions you have with your health care provider.

## 2013-08-27 ENCOUNTER — Telehealth: Payer: Self-pay | Admitting: Family Medicine

## 2013-08-27 NOTE — Telephone Encounter (Signed)
Please call patient.  I did receive a message back from Dr. Cleta Albertsaub - she can be scheduled for physical in clinic with him if time available prior to her insurance deadline, or see him at 102  (as walk in)  prior to deadline if appt not available. Thanks.

## 2013-09-02 ENCOUNTER — Telehealth: Payer: Self-pay | Admitting: Family Medicine

## 2013-09-02 NOTE — Telephone Encounter (Signed)
appt made for CPE in Sept.

## 2013-10-19 ENCOUNTER — Ambulatory Visit (INDEPENDENT_AMBULATORY_CARE_PROVIDER_SITE_OTHER): Payer: No Typology Code available for payment source | Admitting: Emergency Medicine

## 2013-10-19 ENCOUNTER — Encounter: Payer: Self-pay | Admitting: Emergency Medicine

## 2013-10-19 VITALS — BP 108/70 | HR 75 | Temp 98.5°F | Resp 16 | Ht 67.75 in | Wt 155.6 lb

## 2013-10-19 DIAGNOSIS — Z23 Encounter for immunization: Secondary | ICD-10-CM

## 2013-10-19 DIAGNOSIS — E041 Nontoxic single thyroid nodule: Secondary | ICD-10-CM

## 2013-10-19 DIAGNOSIS — Z0000 Encounter for general adult medical examination without abnormal findings: Secondary | ICD-10-CM

## 2013-10-19 DIAGNOSIS — E063 Autoimmune thyroiditis: Secondary | ICD-10-CM

## 2013-10-19 LAB — POCT URINALYSIS DIPSTICK
BILIRUBIN UA: NEGATIVE
GLUCOSE UA: NEGATIVE
Ketones, UA: NEGATIVE
LEUKOCYTES UA: NEGATIVE
NITRITE UA: NEGATIVE
Protein, UA: NEGATIVE
Spec Grav, UA: <=1.005
Urobilinogen, UA: 0.2
pH, UA: 5.5

## 2013-10-19 LAB — CBC WITH DIFFERENTIAL/PLATELET
BASOS ABS: 0 10*3/uL (ref 0.0–0.1)
Basophils Relative: 0 % (ref 0–1)
Eosinophils Absolute: 0.1 10*3/uL (ref 0.0–0.7)
Eosinophils Relative: 1 % (ref 0–5)
HEMATOCRIT: 41.3 % (ref 36.0–46.0)
Hemoglobin: 13.9 g/dL (ref 12.0–15.0)
Lymphocytes Relative: 33 % (ref 12–46)
Lymphs Abs: 2.4 10*3/uL (ref 0.7–4.0)
MCH: 29.4 pg (ref 26.0–34.0)
MCHC: 33.7 g/dL (ref 30.0–36.0)
MCV: 87.3 fL (ref 78.0–100.0)
MONO ABS: 0.4 10*3/uL (ref 0.1–1.0)
Monocytes Relative: 6 % (ref 3–12)
NEUTROS ABS: 4.3 10*3/uL (ref 1.7–7.7)
Neutrophils Relative %: 60 % (ref 43–77)
PLATELETS: 203 10*3/uL (ref 150–400)
RBC: 4.73 MIL/uL (ref 3.87–5.11)
RDW: 13.4 % (ref 11.5–15.5)
WBC: 7.2 10*3/uL (ref 4.0–10.5)

## 2013-10-19 NOTE — Progress Notes (Signed)
Subjective:  This chart was scribed for Lesle Chris, MD by Carl Best, Medical Scribe. This patient was seen in Room 21 and the patient's care was started at 1:21 PM.   Patient ID: Kimberly Mooney, female    DOB: 03-30-1970, 43 y.o.   MRN: [id number]  HPI HPI Comments: Kimberly Mooney is a 43 y.o. female who presents to the [facility] for an annual exam.  She saw Dr. Neva Seat 6 weeks ago complaining of palpitations.  She states that her palpitations do not occur as frequently.  She has started drinking only one cup of coffee and one glass of wine.  Her next mammogram is scheduled for December.  She had a normal MRI taken of her right breast that only showed small, simple, benign cysts.  She is having an MRI done of her left shoulder and is being followed by Dr. Luiz Blare.  She had a root canal last week.  Her PAPs are managed by [facility].  She is not fasting today.  Her work will not do her lipid panel screening.  She had one incidence of hyperlipidemia but states that it resolved after a year when she cut red meat out of her diet.  She does not have a history of skin cancers.  She saw Dr. Danella Deis in March who took biopsies of various moles on her body which came back benign.  She had a normal EKG taken 6 weeks ago.  She has taken thyroid medication since she was in high school.  Her last TDAP was 7-8 years ago.  She will receive a flu shot at her job.  She exercises 3-5 days a week with a trainer.  She does not smoke.    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     Past Medical History  Diagnosis Date  . GERD (gastroesophageal reflux disease)   . Thyroid disease     hypothyroidism  . Gallstones   . Depression   . Anxiety    Past Surgical History  Procedure Laterality Date  . Cholecystectomy     Family History  Problem Relation Age of Onset  . Cancer Mother     breast  . Hypertension Mother   . Osteoporosis Mother   . Hypertension Father    History    Social History  . Marital Status: Single    Spouse Name: N/A    Number of Children: N/A  . Years of Education: N/A   Occupational History  . Not on file.   Social History Main Topics  . Smoking status: Former Games developer  . Smokeless tobacco: Not on file     Comment: smoked occasionally in college  . Alcohol Use: Yes     Comment: social  . Drug Use: No  . Sexual Activity: Yes    Birth Control/ Protection: IUD   Other Topics Concern  . Not on file   Social History Narrative  . No narrative on file   Allergies  Allergen Reactions  . Amoxicillin Diarrhea  . Augmentin [Amoxicillin-Pot Clavulanate] Diarrhea  . Doxycycline   . Shellfish Allergy     Review of Systems  All other systems reviewed and are negative.    Objective:  Physical Exam  Nursing note and vitals reviewed. Constitutional: She is oriented to person, place, and time. She appears well-developed and well-nourished.  HENT:  Head: Normocephalic and atraumatic.  Right Ear: External ear normal.  Left Ear: External ear normal.  Nose: Nose  normal.  Mouth/Throat: Oropharynx is clear and moist. No oropharyngeal exudate.  Eyes: Conjunctivae and EOM are normal. Pupils are equal, round, and reactive to light.  Neck: Normal range of motion. Neck supple. Thyromegaly present.  Thyroid is symmetrical, slightly enlarged, and non-tender.  Cardiovascular: Normal rate, regular rhythm and normal heart sounds.   No murmur heard. Pulmonary/Chest: Effort normal and breath sounds normal. No respiratory distress. She has no wheezes. She has no rales.  Abdominal: Soft. Bowel sounds are normal. There is no tenderness.  Musculoskeletal: Normal range of motion. She exhibits no edema.  Lymphadenopathy:    She has no cervical adenopathy.  Neurological: She is alert and oriented to person, place, and time. She has normal reflexes.  Skin: Skin is warm and dry. No rash noted.  Hemangioma on the right side of her neck.  1x2in flat  pigmented macule on the top of the right foot.    Psychiatric: She has a normal mood and affect. Her behavior is normal.     BP 108/70  Pulse 75  Temp(Src) 98.5 F (36.9 C) (Oral)  Resp 16  Ht 5' 7.75" (1.721 m)  Wt 155 lb 9.6 oz (70.58 kg)  BMI 23.83 kg/m2  SpO2 100%  LMP 09/30/2013 Assessment & Plan:  I personally performed the services described in this documentation, which was scribed in my presence. The recorded information has been reviewed and is accurate. Pdet was updated. She will get her flu shot at work. She has a GYN appointment this fall and have her Pap smear done at that time and is scheduled for screening mammography in December.

## 2013-10-19 NOTE — Progress Notes (Deleted)
   Subjective:    Patient ID: Kimberly Mooney, female    DOB: [birth year], 43 y.o.   MRN: [id number]  HPI    Review of Systems  Constitutional: Negative.   HENT: Negative.   Eyes: Negative.   Respiratory: Negative.   Cardiovascular: Negative.   Gastrointestinal: Negative.   Endocrine: Negative.   Genitourinary: Negative.   Musculoskeletal: Positive for arthralgias.  Skin: Negative.   Allergic/Immunologic: Positive for food allergies.  Neurological: Negative.   Hematological: Negative.   Psychiatric/Behavioral: Negative.        Objective:   Physical Exam        Assessment & Plan:

## 2013-10-20 DIAGNOSIS — E063 Autoimmune thyroiditis: Secondary | ICD-10-CM | POA: Insufficient documentation

## 2013-10-20 LAB — COMPLETE METABOLIC PANEL WITH GFR
ALT: 12 U/L (ref 0–35)
AST: 16 U/L (ref 0–37)
Albumin: 4.4 g/dL (ref 3.5–5.2)
Alkaline Phosphatase: 60 U/L (ref 39–117)
BUN: 10 mg/dL (ref 6–23)
CO2: 27 mEq/L (ref 19–32)
CREATININE: 0.96 mg/dL (ref 0.50–1.10)
Calcium: 9.4 mg/dL (ref 8.4–10.5)
Chloride: 102 mEq/L (ref 96–112)
GFR, Est African American: 83 mL/min
GFR, Est Non African American: 72 mL/min
Glucose, Bld: 94 mg/dL (ref 70–99)
POTASSIUM: 4.5 meq/L (ref 3.5–5.3)
Sodium: 137 mEq/L (ref 135–145)
Total Bilirubin: 0.4 mg/dL (ref 0.2–1.2)
Total Protein: 6.7 g/dL (ref 6.0–8.3)

## 2013-10-20 LAB — T4, FREE: Free T4: 1.36 ng/dL (ref 0.80–1.80)

## 2013-10-20 LAB — THYROID ANTIBODIES
THYROGLOBULIN AB: 1 [IU]/mL (ref <2)
THYROID PEROXIDASE ANTIBODY: 448 [IU]/mL — AB (ref <9)

## 2013-10-20 LAB — TSH: TSH: 2.951 u[IU]/mL (ref 0.350–4.500)

## 2013-10-25 ENCOUNTER — Encounter: Payer: Self-pay | Admitting: Emergency Medicine

## 2013-10-25 DIAGNOSIS — R319 Hematuria, unspecified: Secondary | ICD-10-CM

## 2013-11-02 ENCOUNTER — Other Ambulatory Visit (HOSPITAL_COMMUNITY)
Admission: RE | Admit: 2013-11-02 | Discharge: 2013-11-02 | Disposition: A | Discharge status: Home / Self Care | Payer: No Typology Code available for payment source | Source: Ambulatory Visit | Attending: Nurse Practitioner | Admitting: Nurse Practitioner

## 2013-11-02 ENCOUNTER — Other Ambulatory Visit: Payer: Self-pay | Admitting: Nurse Practitioner

## 2013-11-02 DIAGNOSIS — Z01419 Encounter for gynecological examination (general) (routine) without abnormal findings: Secondary | ICD-10-CM | POA: Diagnosis not present

## 2013-11-02 DIAGNOSIS — Z1151 Encounter for screening for human papillomavirus (HPV): Secondary | ICD-10-CM | POA: Diagnosis present

## 2013-11-04 LAB — CYTOLOGY - PAP

## 2013-11-08 ENCOUNTER — Ambulatory Visit
Admission: RE | Admit: 2013-11-08 | Discharge: 2013-11-08 | Disposition: A | Discharge status: Home / Self Care | Payer: No Typology Code available for payment source | Source: Ambulatory Visit | Attending: Emergency Medicine | Admitting: Emergency Medicine

## 2013-11-08 DIAGNOSIS — R319 Hematuria, unspecified: Secondary | ICD-10-CM

## 2013-12-20 ENCOUNTER — Other Ambulatory Visit: Payer: Self-pay | Admitting: Emergency Medicine

## 2014-01-10 ENCOUNTER — Ambulatory Visit (INDEPENDENT_AMBULATORY_CARE_PROVIDER_SITE_OTHER): Payer: No Typology Code available for payment source | Admitting: Internal Medicine

## 2014-01-10 VITALS — BP 122/84 | HR 70 | Temp 98.7°F | Resp 18 | Ht 67.75 in | Wt 156.6 lb

## 2014-01-10 DIAGNOSIS — F419 Anxiety disorder, unspecified: Secondary | ICD-10-CM

## 2014-01-10 DIAGNOSIS — M79674 Pain in right toe(s): Secondary | ICD-10-CM

## 2014-01-10 DIAGNOSIS — L03818 Cellulitis of other sites: Secondary | ICD-10-CM

## 2014-01-10 MED ORDER — CEPHALEXIN 500 MG PO CAPS: 500.0000 mg | ORAL_CAPSULE | Freq: Three times a day (TID) | ORAL | Status: DC

## 2014-01-10 MED ORDER — PAROXETINE HCL 10 MG PO TABS: ORAL_TABLET | ORAL | Status: DC

## 2014-01-10 NOTE — Progress Notes (Signed)
Subjective:  This chart was scribed for Kimberly Siaobert Angel Hobdy, MD by Richarda Overlieichard Holland, Medical scribe. This patient was seen in ROOM 9 and the patient's care was started 8:38 PM.   Patient ID: Kimberly Mooney, female    DOB: Nov 09, 1970, 43 y.o.   MRN: [id number]   Chief Complaint  Patient presents with  . Toe Infection    Big toe on right foot. x3 weeks. Went to [facility] and was prescribed Bactrim. No improvement. Looks to be worse.     HPI HPI Comments: Kimberly GarbeWendy Helget is a 43 y.o. female who [facility] complaining of possible right big toe infection for the past 3 weeks. She states that she did not drop anything on the toe and has never broken it. Pt reports she has been on bactrim for the last 10 days which she received from the [facility]. She states she started to notice some pus underneath the nail and says the area drained a few days ago. Pt reports the last dose of her Bactrim was this morning and she is not better. She reports she has had minimal pain when walking.. She reports no other symptoms at this time. She reports she is allergic to amoxicillin, doxycycline and penicillins.    Pt also states she forgot to ask Dr. Cleta Albertsaub for a refill for Paxil medication on her last visit and wants to get it refilled at this time.   Patient Active Problem List   Diagnosis Date Noted  . Hashimoto's thyroiditis 10/20/2013  . Thyroid nodule 07/10/2013  . Chronic cholecystitis with calculus 12/30/2011  . PLEVA (pityriasis lichenoides et varioliformis acuta) 12/10/2011   Past Medical History  Diagnosis Date  . GERD (gastroesophageal reflux disease)   . Thyroid disease     hypothyroidism  . Gallstones   . Depression   . Anxiety    Current Outpatient Prescriptions on File Prior to Visit  Medication Sig Dispense Refill  . diphenhydrAMINE (BENADRYL) 25 MG tablet Take 25 mg by mouth every 8 (eight) hours as needed for sleep.    . lansoprazole (PREVACID) 15 MG capsule Take 15  mg by mouth daily.    Marland Kitchen. levonorgestrel (MIRENA) 20 MCG/24HR IUD 1 each by Intrauterine route once.    Marland Kitchen. levothyroxine (SYNTHROID, LEVOTHROID) 137 MCG tablet Take 1 tablet (137 mcg total) by mouth daily. 30 tablet 11  . PARoxetine (PAXIL) 10 MG tablet TAKE ONE TABLET BY MOUTH ONCE DAILY IN THE MORNING  "NEEDS FOLLOW UP FOR ADDITIONAL REFILLS" 30 tablet 0  . fluticasone (FLONASE) 50 MCG/ACT nasal spray Place 1-2 sprays into both nostrils daily. (Patient not taking: Reported on 01/10/2014) 16 g 3   No current facility-administered medications on file prior to visit.   Allergies  Allergen Reactions  . Amoxicillin Diarrhea  . Augmentin [Amoxicillin-Pot Clavulanate] Diarrhea  . Codeine Nausea And Vomiting  . Doxycycline   . Penicillins     Diarrhea, severe abdominal pain  . Shellfish Allergy    Family History  Problem Relation Age of Onset  . Cancer Mother     breast  . Hypertension Mother   . Osteoporosis Mother   . Hypertension Father    Past Surgical History  Procedure Laterality Date  . Cholecystectomy      Review of Systems  Constitutional: Negative for fever.  Skin: Positive for color change.      Objective:   Physical Exam  Constitutional: She is oriented to person, place, and time. She appears well-developed and well-nourished. No distress.  HENT:  Head: Normocephalic and atraumatic.  Eyes: Conjunctivae and EOM are normal. Pupils are equal, round, and reactive to light.  Neck: Neck supple.  Cardiovascular: Normal rate.   Pulmonary/Chest: Effort normal.  Neurological: She is alert and oriented to person, place, and time. No cranial nerve deficit.  Skin:  Right great toe has pus proximally under the nail and in the nail bed area where there is very little tenderness. Slight redness.  lancing in the nailbed produces slight amount of pus. Hole drilled in the nail produces blood but no pus.   Psychiatric: She has a normal mood and affect. Her behavior is normal.    Nursing note and vitals reviewed.  no fungal changes noted Filed Vitals:   01/10/14 1920  BP: 122/84  Pulse: 70  Temp: 98.7 F (37.1 C)  TempSrc: Oral  Resp: 18  Height: 5' 7.75" (1.721 m)  Weight: 156 lb 10.1 oz (71.047 kg)  SpO2: 97%      Assessment & Plan:  Cellulitis-mild R Toe #1/// - Plan: Wound culture///keFlex 500 3 times a day  This is not the usual paronychia as the nail edges are well maintained/the etiology of this is unclear  Anxiety-refill Paxil  F/u 10d if not well---probably toenail removal next  I have completed the patient encounter in its entirety as documented by the scribe, with editing by me where necessary. Braxon Suder P. Merla Richesoolittle, M.D.

## 2014-01-11 ENCOUNTER — Ambulatory Visit: Payer: No Typology Code available for payment source | Admitting: Family Medicine

## 2014-01-13 LAB — WOUND CULTURE: Gram Stain: NONE SEEN

## 2014-02-05 ENCOUNTER — Ambulatory Visit (INDEPENDENT_AMBULATORY_CARE_PROVIDER_SITE_OTHER): Payer: No Typology Code available for payment source | Admitting: Emergency Medicine

## 2014-02-05 VITALS — BP 122/74 | HR 72 | Temp 98.3°F | Resp 17 | Ht 66.5 in | Wt 154.0 lb

## 2014-02-05 DIAGNOSIS — J029 Acute pharyngitis, unspecified: Secondary | ICD-10-CM

## 2014-02-05 LAB — POCT RAPID STREP A (OFFICE): Rapid Strep A Screen: NEGATIVE

## 2014-02-05 MED ORDER — FIRST-DUKES MOUTHWASH MT SUSP: OROMUCOSAL | Status: DC

## 2014-02-05 NOTE — Progress Notes (Signed)
   Subjective:    Patient ID: Kimberly Mooney, female    DOB: 1970-03-03, 43 y.o.   MRN: [id number]  HPI patient enters with 3-4 day history is sore throat. She has no documented strep exposure. She has had difficulty swallowing minimal dry cough no fevers chills or other symptoms. She denies myalgias.    Review of Systems Patient complains of a very sore throat, ears feel full, no cough, mild fever a couple days ago, no fever now, workmates have been sick     Objective:   Physical Exam  Constitutional: She appears well-developed and well-nourished.  HENT:  Head: Normocephalic.  Right Ear: External ear normal.  Left Ear: External ear normal.  The throat is slightly red. Minimal amount of exudate on the left.  Eyes: Pupils are equal, round, and reactive to light.  Neck: No thyromegaly present.  Cardiovascular: Normal rate and regular rhythm.   Pulmonary/Chest: Effort normal and breath sounds normal. No respiratory distress.    Results for orders placed or performed in visit on 02/05/14  POCT rapid strep A  Result Value Ref Range   Rapid Strep A Screen Negative Negative        Assessment & Plan:  Strep culture has been done. Will treat symptomatically pending culture result.

## 2014-02-05 NOTE — Patient Instructions (Signed)

## 2014-02-07 LAB — CULTURE, GROUP A STREP: ORGANISM ID, BACTERIA: NORMAL

## 2014-02-12 ENCOUNTER — Ambulatory Visit (INDEPENDENT_AMBULATORY_CARE_PROVIDER_SITE_OTHER): Payer: No Typology Code available for payment source | Admitting: Family Medicine

## 2014-02-12 VITALS — BP 134/86 | HR 74 | Temp 98.1°F | Resp 16 | Ht 67.0 in | Wt 153.0 lb

## 2014-02-12 DIAGNOSIS — Z113 Encounter for screening for infections with a predominantly sexual mode of transmission: Secondary | ICD-10-CM

## 2014-02-12 LAB — HEPATITIS B SURFACE ANTIBODY, QUANTITATIVE: Hepatitis B-Post: 0 m[IU]/mL

## 2014-02-12 LAB — HEPATITIS C ANTIBODY: HCV Ab: NEGATIVE

## 2014-02-12 LAB — HIV ANTIBODY (ROUTINE TESTING W REFLEX): HIV: NONREACTIVE

## 2014-02-12 LAB — RPR

## 2014-02-12 LAB — HEPATITIS B SURFACE ANTIGEN: HEP B S AG: NEGATIVE

## 2014-02-12 NOTE — Progress Notes (Signed)
[facility] [address], [place] [address] [phone]  Date:  02/12/2014   Name:  Kimberly Mooney   DOB:  03-18-1970   MRN:  [id number]  PCP:  Lucilla Edin, MD    Chief Complaint: Follow-up   History of Present Illness:  Kimberly Mooney is a 44 y.o. very pleasant female patient who presents with the following:  Here today to follow-up ST.  She was here on 12/26 with ST, negative rapid and negative throat cultures.  Symptomatic treatment only at that time. However she admits that she had a sexual encounter with someone not her usual partner, and was afraid that she might have been exposed to gonorrhea.  A friend who is an MD called her in 2gm of azithromycin a week ago.  She started to feel better, but then her ST returned over the last couple of days. It is not sever, but she is afraid of gonorrhea and would like to be tested for this and other STI.   There have been exposures to illness at her job.  She has noted a runny nose and nasal discharge. No fever.    Patient Active Problem List   Diagnosis Date Noted  . Anxiety 01/10/2014  . Hashimoto's thyroiditis 10/20/2013  . Thyroid nodule 07/10/2013  . Chronic cholecystitis with calculus 12/30/2011  . PLEVA (pityriasis lichenoides et varioliformis acuta) 12/10/2011    Past Medical History  Diagnosis Date  . GERD (gastroesophageal reflux disease)   . Thyroid disease     hypothyroidism  . Gallstones   . Depression   . Anxiety     Past Surgical History  Procedure Laterality Date  . Cholecystectomy      History  Substance Use Topics  . Smoking status: Former Games developer  . Smokeless tobacco: Not on file     Comment: smoked occasionally in college  . Alcohol Use: Yes     Comment: social    Family History  Problem Relation Age of Onset  . Cancer Mother     breast  . Hypertension Mother   . Osteoporosis Mother   . Hypertension Father     Allergies  Allergen Reactions  . Amoxicillin  Diarrhea  . Augmentin [Amoxicillin-Pot Clavulanate] Diarrhea  . Codeine Nausea And Vomiting  . Doxycycline   . Penicillins     Diarrhea, severe abdominal pain  . Shellfish Allergy     Medication list has been reviewed and updated.  Current Outpatient Prescriptions on File Prior to Visit  Medication Sig Dispense Refill  . Diphenhyd-Hydrocort-Nystatin (FIRST-DUKES MOUTHWASH) SUSP 1 teaspoon as rinse gargle and spit 4 times a day 120 mL 1  . diphenhydrAMINE (BENADRYL) 25 MG tablet Take 25 mg by mouth every 8 (eight) hours as needed for sleep.    . lansoprazole (PREVACID) 15 MG capsule Take 15 mg by mouth daily.    Marland Kitchen levonorgestrel (MIRENA) 20 MCG/24HR IUD 1 each by Intrauterine route once.    Marland Kitchen levothyroxine (SYNTHROID, LEVOTHROID) 137 MCG tablet Take 1 tablet (137 mcg total) by mouth daily. 30 tablet 11  . PARoxetine (PAXIL) 10 MG tablet TAKE ONE TABLET BY MOUTH ONCE DAILY IN THE MORNING 90 tablet 3   No current facility-administered medications on file prior to visit.    Review of Systems:  As per HPI- otherwise negative.   Physical Examination: Filed Vitals:   02/12/14 0808  BP: 134/86  Pulse: 74  Temp: 98.1 F (36.7 C)  Resp: 16   Filed  Vitals:   02/12/14 0808  Height:  (1.702 m)  Weight: 153 lb (69.4 kg)   Body mass index is 23.96 kg/(m^2). Ideal Body Weight: Weight in (lb) to have BMI = 25: 159.3  GEN: WDWN, NAD, Non-toxic, A & O x 3, looks well HEENT: Atraumatic, Normocephalic. Neck supple. No masses, No LAD.  Bilateral TM wnl, oropharynx normal.  PEERL,EOMI.   Ears and Nose: No external deformity. CV: RRR, No M/G/R. No JVD. No thrill. No extra heart sounds. PULM: CTA B, no wheezes, crackles, rhonchi. No retractions. No resp. distress. No accessory muscle use. EXTR: No c/c/e NEURO Normal gait.  PSYCH: Normally interactive. Conversant. Not depressed or anxious appearing.  Calm demeanor.    Assessment and Plan: Screening for STD (sexually transmitted  disease) - Plan: GC/Chlamydia Probe Amp, Hepatitis B surface antibody, Hepatitis B surface antigen, Hepatitis C antibody, HIV antibody, HSV(herpes simplex vrs) 1+2 ab-IgG, RPR, Gonococcus culture  Screening for STI as above.    Signed Abbe Amsterdam, MD

## 2014-02-12 NOTE — Patient Instructions (Signed)
I will be in touch with your labs asap.   Let me know if you have any other problems in the meantime.

## 2014-02-14 LAB — GONOCOCCUS CULTURE

## 2014-02-14 LAB — HSV(HERPES SIMPLEX VRS) I + II AB-IGG
HSV 1 Glycoprotein G Ab, IgG: <0.1 IV
HSV 2 GLYCOPROTEIN G AB, IGG: 0.15 IV

## 2014-02-16 LAB — GC/CHLAMYDIA PROBE AMP
CT PROBE, AMP APTIMA: NEGATIVE
GC Probe RNA: NEGATIVE

## 2014-04-26 ENCOUNTER — Encounter: Payer: Self-pay | Admitting: Emergency Medicine

## 2014-04-26 ENCOUNTER — Ambulatory Visit (INDEPENDENT_AMBULATORY_CARE_PROVIDER_SITE_OTHER): Payer: No Typology Code available for payment source

## 2014-04-26 ENCOUNTER — Ambulatory Visit (INDEPENDENT_AMBULATORY_CARE_PROVIDER_SITE_OTHER): Payer: No Typology Code available for payment source | Admitting: Emergency Medicine

## 2014-04-26 VITALS — BP 120/82 | HR 80 | Temp 98.8°F | Resp 16 | Ht 66.75 in | Wt 153.2 lb

## 2014-04-26 DIAGNOSIS — M546 Pain in thoracic spine: Secondary | ICD-10-CM | POA: Diagnosis not present

## 2014-04-26 DIAGNOSIS — M542 Cervicalgia: Secondary | ICD-10-CM

## 2014-04-26 NOTE — Progress Notes (Signed)
Subjective:  This chart was scribed for Collene GobbleSteven A Tanee Henery, MD by [name], ED Scribe. The patient was seen in room 23. Patient's care was started at 11:52 AM.   Patient ID: Kimberly Mooney, female    DOB: January 19, 1971, 44 y.o.   MRN: [id number]  Chief Complaint  Patient presents with  . Back Pain    upper back pain from a fall 2 wks ago, downstairs   HPI  HPI Comments: Kimberly GarbeWendy Sheckler is a 44 y.o. female, with a h/o hypothyroidism, gallstones, GERD, anxiety, depression, who [facility] complaining of persistent upper back pain onset 2.5 weeks ago. Pt states that she was walking in socks in her home when she slipped and fell down carpeted stairs 2.5 weeks ago. She states that she hit her upper back on the stairs upon falling. She denies numbness, weakness, shoulder pain. Pt has not had any XRs obtained. She reports h/o intermittent neck pain with "cracking" while turning her neck. Pt has been treating pain with 400-800 Advil a few times a day with mild relief.   Birth Control  Pt will have an [place] guided Mirena removal on Thursday, 04/28/14. She reports that her boyfriend has had a vasectomy.   Past Medical History  Diagnosis Date  . GERD (gastroesophageal reflux disease)   . Thyroid disease     hypothyroidism  . Gallstones   . Depression   . Anxiety    Current Outpatient Prescriptions on File Prior to Visit  Medication Sig Dispense Refill  . diphenhydrAMINE (BENADRYL) 25 MG tablet Take 25 mg by mouth every 8 (eight) hours as needed for sleep.    . lansoprazole (PREVACID) 15 MG capsule Take 15 mg by mouth daily.    Marland Kitchen. levonorgestrel (MIRENA) 20 MCG/24HR IUD 1 each by Intrauterine route once.    Marland Kitchen. levothyroxine (SYNTHROID, LEVOTHROID) 137 MCG tablet Take 1 tablet (137 mcg total) by mouth daily. 30 tablet 11  . PARoxetine (PAXIL) 10 MG tablet TAKE ONE TABLET BY MOUTH ONCE DAILY IN THE MORNING 90 tablet 3  . Diphenhyd-Hydrocort-Nystatin (FIRST-DUKES  MOUTHWASH) SUSP 1 teaspoon as rinse gargle and spit 4 times a day (Patient not taking: Reported on 04/26/2014) 120 mL 1   No current facility-administered medications on file prior to visit.   Allergies  Allergen Reactions  . Amoxicillin Diarrhea  . Augmentin [Amoxicillin-Pot Clavulanate] Diarrhea  . Codeine Nausea And Vomiting  . Doxycycline   . Penicillins     Diarrhea, severe abdominal pain  . Shellfish Allergy    Review of Systems  Musculoskeletal: Positive for back pain and neck pain (intermittent). Negative for arthralgias.  Neurological: Negative for weakness and numbness.      Objective:   Physical Exam CONSTITUTIONAL: Well developed/well nourished HEAD: Normocephalic/atraumatic EYES: EOMI/PERRL ENMT: Mucous membranes moist NECK: supple no meningeal signs SPINE/BACK: tender to L paracervical and perithoracic muscles  CV: S1/S2 noted, no murmurs/rubs/gallops noted LUNGS: Lungs are clear to auscultation bilaterally, no apparent distress ABDOMEN: soft, nontender, no rebound or guarding, bowel sounds noted throughout abdomen GU:no cva tenderness NEURO: Pt is awake/alert/appropriate, moves all extremitiesx4.  No facial droop.   EXTREMITIES: pulses normal/equal, full ROM, upper extremity strength and DTRs 2+ SKIN: warm, color normal PSYCH: no abnormalities of mood noted, alert and oriented to situation   [facility] reading (PRIMARY) by  Dr.Margert Edsall there is C5-C6 degenerative disc disease. Thoracic spine films are normal.  Assessment & Plan:  Will continue treatment with ibuprofen. She will see  her chiropractor for physical therapy. She will call if she needs referral to a physical therapist or requests a muscle relaxant.I personally performed the services described in this documentation, which was scribed in my presence. The recorded information has been reviewed and is accurate.

## 2014-04-27 ENCOUNTER — Telehealth: Payer: Self-pay | Admitting: Emergency Medicine

## 2014-04-27 ENCOUNTER — Other Ambulatory Visit: Payer: Self-pay | Admitting: Emergency Medicine

## 2014-04-27 DIAGNOSIS — M5010 Cervical disc disorder with radiculopathy, unspecified cervical region: Secondary | ICD-10-CM

## 2014-04-27 MED ORDER — CYCLOBENZAPRINE HCL 5 MG PO TABS: ORAL_TABLET | ORAL | Status: DC

## 2014-04-27 NOTE — Telephone Encounter (Signed)
Please call patient and tell her I sent in a prescription for Flexeril to take at night

## 2014-04-27 NOTE — Telephone Encounter (Signed)
Spoke with pt, advised message. Pt understood. 

## 2014-07-14 ENCOUNTER — Other Ambulatory Visit: Payer: Self-pay | Admitting: Emergency Medicine

## 2014-07-15 ENCOUNTER — Encounter: Payer: Self-pay | Admitting: Emergency Medicine

## 2014-07-15 NOTE — Telephone Encounter (Signed)
Sent in Rf and notified pt on MyChart.

## 2014-10-04 ENCOUNTER — Ambulatory Visit (INDEPENDENT_AMBULATORY_CARE_PROVIDER_SITE_OTHER): Payer: No Typology Code available for payment source | Admitting: Emergency Medicine

## 2014-10-04 ENCOUNTER — Encounter: Payer: Self-pay | Admitting: Physician Assistant

## 2014-10-04 VITALS — BP 138/86 | HR 75 | Temp 98.3°F | Resp 16 | Ht 67.0 in | Wt 150.0 lb

## 2014-10-04 DIAGNOSIS — R002 Palpitations: Secondary | ICD-10-CM

## 2014-10-04 DIAGNOSIS — Z13220 Encounter for screening for lipoid disorders: Secondary | ICD-10-CM | POA: Diagnosis not present

## 2014-10-04 DIAGNOSIS — N393 Stress incontinence (female) (male): Secondary | ICD-10-CM

## 2014-10-04 DIAGNOSIS — R319 Hematuria, unspecified: Secondary | ICD-10-CM

## 2014-10-04 DIAGNOSIS — Z0000 Encounter for general adult medical examination without abnormal findings: Secondary | ICD-10-CM

## 2014-10-04 DIAGNOSIS — E063 Autoimmune thyroiditis: Secondary | ICD-10-CM | POA: Diagnosis not present

## 2014-10-04 DIAGNOSIS — E010 Iodine-deficiency related diffuse (endemic) goiter: Secondary | ICD-10-CM

## 2014-10-04 DIAGNOSIS — F419 Anxiety disorder, unspecified: Secondary | ICD-10-CM

## 2014-10-04 LAB — CBC
HCT: 39.9 % (ref 36.0–46.0)
Hemoglobin: 13.2 g/dL (ref 12.0–15.0)
MCH: 29.3 pg (ref 26.0–34.0)
MCHC: 33.1 g/dL (ref 30.0–36.0)
MCV: 88.5 fL (ref 78.0–100.0)
MPV: 12.2 fL (ref 8.6–12.4)
Platelets: 212 10*3/uL (ref 150–400)
RBC: 4.51 MIL/uL (ref 3.87–5.11)
RDW: 13.3 % (ref 11.5–15.5)
WBC: 6.5 10*3/uL (ref 4.0–10.5)

## 2014-10-04 LAB — COMPREHENSIVE METABOLIC PANEL
ALK PHOS: 54 U/L (ref 33–115)
ALT: 14 U/L (ref 6–29)
AST: 19 U/L (ref 10–30)
Albumin: 4.3 g/dL (ref 3.6–5.1)
BUN: 12 mg/dL (ref 7–25)
CO2: 25 mmol/L (ref 20–31)
Calcium: 9.3 mg/dL (ref 8.6–10.2)
Chloride: 104 mmol/L (ref 98–110)
Creat: 0.93 mg/dL (ref 0.50–1.10)
Glucose, Bld: 97 mg/dL (ref 65–99)
POTASSIUM: 3.8 mmol/L (ref 3.5–5.3)
Sodium: 138 mmol/L (ref 135–146)
Total Bilirubin: 0.6 mg/dL (ref 0.2–1.2)
Total Protein: 6.9 g/dL (ref 6.1–8.1)

## 2014-10-04 LAB — LIPID PANEL
CHOL/HDL RATIO: 2.9 ratio (ref <=5.0)
Cholesterol: 174 mg/dL (ref 125–200)
HDL: 60 mg/dL (ref >=46)
LDL Cholesterol: 96 mg/dL (ref <130)
Triglycerides: 91 mg/dL (ref <150)
VLDL: 18 mg/dL (ref <30)

## 2014-10-04 LAB — POCT URINALYSIS DIPSTICK
Bilirubin, UA: NEGATIVE
Glucose, UA: NEGATIVE
Ketones, UA: NEGATIVE
Leukocytes, UA: NEGATIVE
NITRITE UA: NEGATIVE
PH UA: 7.5
Protein, UA: NEGATIVE
Spec Grav, UA: 1.01
UROBILINOGEN UA: 0.2

## 2014-10-04 LAB — POCT UA - MICROSCOPIC ONLY
Bacteria, U Microscopic: NEGATIVE
CASTS, UR, LPF, POC: NEGATIVE
Crystals, Ur, HPF, POC: NEGATIVE
Epithelial cells, urine per micros: NEGATIVE
Mucus, UA: NEGATIVE
WBC, Ur, HPF, POC: NEGATIVE
YEAST UA: NEGATIVE

## 2014-10-04 LAB — TSH: TSH: 10.142 u[IU]/mL — AB (ref 0.350–4.500)

## 2014-10-04 MED ORDER — LEVOTHYROXINE SODIUM 137 MCG PO TABS: 137.0000 ug | ORAL_TABLET | Freq: Every day | ORAL | Status: DC

## 2014-10-04 MED ORDER — PAROXETINE HCL 10 MG PO TABS: ORAL_TABLET | ORAL | Status: DC

## 2014-10-04 NOTE — Progress Notes (Signed)
[facility] [address] [place] [address] [phone]  Date:  10/04/2014   Name:  Kimberly Mooney   DOB:  12-Jun-1970   MRN:  [id number]  PCP:  Lucilla Edin, MD    Chief Complaint: Annual Exam   History of Present Illness:  This is a 45 y.o. female with PMH anxiety, hypothyroidism and GERD who is presenting for CPE.  Complaining of stress incontinence x 1 years. Notices it particularly when working out. Does high intensity interval training and has to make sure she goes to the bathroom before or she will leak urine.   She is also complaining of 2 episodes of heart racing. HR 130 off and on for 10 hours in March 2016. Woke in the middle of the night with this. Happened again in June but only lasted 30 minutes that time. Scalp felt tingly during this episodes but denies SOB, CP, dizziness. Had some nausea with the first episode but not the 2nd. Never any vomiting. Pt states she did not feel stressed when these happened. Never had panic attack before. Has 2-3 cups of coffee a day.   Wondering if she needs another ultrasound of her thyroid. Had an ultrasound 1 year ago d/t thyromegaly on exam. U/s revealed thyromegaly with hyperemic inhomogenous parenchyma. Dr. Cleta Alberts recommended repeat u/s in 6 months which she never had. She is needing refills of her synthroid today.  Anxiety: been on paxil since 2002. Working well for her. Sometimes has a hard time sleeping. Take 1/2 benadryl every night. Sometimes works, sometimes doesn't. She has never tried melatonin  Got mirena taken out. She started a new relationship 8 months ago. Her and her partner got STD testing prior to becoming sexually active. Her partner had a vasectomy, no contraceptive other than the vasectomy.  She is a Marketing executive.  LMP: 09/23/14 Last pap: last pap 11/2013 at OB/GYN. Had HPV in 2008, normal colposcopy. Since then all paps normal. Gets breast exams yearly at GYN. Immunizations: tdap  2015, getting flu shot at work. Dentist: last went 3-4 months ago Eye: no changes in vision, has reading glasses but rarely wears Diet/Exercise: HIT exercise 2-4 times a week, also goes for long walks. Fam hx: mother breast cancer dx'd age 55 and HTN, dad with HTN and anxiety, MGF and PGM with macular degeneration. Tobacco/alcohol/substance use: no/2-3 drinks per week/no Mammogram: last in past 1 year. Makes own appts. Always normal.  Review of Systems:  Review of Systems  Constitutional: Negative.   HENT: Negative.   Eyes: Negative.   Respiratory: Negative.   Cardiovascular: Positive for palpitations. Negative for chest pain and leg swelling.  Gastrointestinal: Negative.   Endocrine: Negative.   Genitourinary: Negative.        Stress incontinence  Musculoskeletal: Negative.   Skin: Negative.   Allergic/Immunologic: Negative.   Neurological: Negative.   Hematological: Negative.   Psychiatric/Behavioral: Negative.      Patient Active Problem List   Diagnosis Date Noted  . Anxiety 01/10/2014  . Hashimoto's thyroiditis 10/20/2013  . Thyroid nodule 07/10/2013  . Chronic cholecystitis with calculus 12/30/2011  . PLEVA (pityriasis lichenoides et varioliformis acuta) 12/10/2011   Prior to Admission medications   Medication Sig Start Date End Date Taking? Authorizing Provider  diphenhydrAMINE (BENADRYL) 25 MG tablet Take 25 mg by mouth every 8 (eight) hours as needed for sleep.   Yes Historical Provider, MD  lansoprazole (PREVACID) 15 MG capsule Take 15 mg by mouth daily.  Yes Historical Provider, MD  levothyroxine (SYNTHROID, LEVOTHROID) 137 MCG tablet TAKE ONE TABLET BY MOUTH ONCE DAILY 07/15/14  Yes Collene Gobble, MD  PARoxetine (PAXIL) 10 MG tablet TAKE ONE TABLET BY MOUTH ONCE DAILY IN THE MORNING 01/10/14  Yes Tonye Pearson, MD           Allergies  Allergen Reactions  . Amoxicillin Diarrhea  . Augmentin [Amoxicillin-Pot Clavulanate] Diarrhea  . Codeine Nausea And  Vomiting  . Doxycycline   . Penicillins     Diarrhea, severe abdominal pain  . Shellfish Allergy     Past Surgical History  Procedure Laterality Date  . Cholecystectomy      Social History  Substance Use Topics  . Smoking status: Former Games developer  . Smokeless tobacco: None     Comment: smoked occasionally in college  . Alcohol Use: Yes     Comment: social    Family History  Problem Relation Age of Onset  . Cancer Mother     breast  . Hypertension Mother   . Osteoporosis Mother   . Hypertension Father     Medication list has been reviewed and updated.  Physical Examination:  Physical Exam  Constitutional: She is oriented to person, place, and time. She appears well-developed and well-nourished. No distress.  HENT:  Head: Normocephalic and atraumatic.  Right Ear: Hearing, tympanic membrane, external ear and ear canal normal.  Left Ear: Hearing, tympanic membrane, external ear and ear canal normal.  Nose: Nose normal.  Mouth/Throat: Uvula is midline, oropharynx is clear and moist and mucous membranes are normal.  Eyes: Conjunctivae, EOM and lids are normal. Pupils are equal, round, and reactive to light. Right eye exhibits no discharge. Left eye exhibits no discharge. No scleral icterus.  Left eye pterygium  Neck: Trachea normal. Carotid bruit is not present. Thyromegaly present.  Cardiovascular: Normal rate, regular rhythm, normal heart sounds, intact distal pulses and normal pulses.   No murmur heard. Pulmonary/Chest: Effort normal and breath sounds normal. No respiratory distress. She has no wheezes. She has no rhonchi. She has no rales.  Abdominal: Soft. Normal appearance and bowel sounds are normal. She exhibits no abdominal bruit. There is no tenderness.  Musculoskeletal: Normal range of motion.  Lymphadenopathy:       Head (right side): No submental, no submandibular, no tonsillar, no preauricular, no posterior auricular and no occipital adenopathy present.        Head (left side): No submental, no submandibular, no tonsillar, no preauricular, no posterior auricular and no occipital adenopathy present.    She has no cervical adenopathy.  Neurological: She is alert and oriented to person, place, and time. She has normal strength and normal reflexes. No cranial nerve deficit or sensory deficit. Coordination and gait normal.  Skin: Skin is warm, dry and intact. No lesion and no rash noted.  Psychiatric: She has a normal mood and affect. Her speech is normal and behavior is normal. Thought content normal.   BP 138/86 mmHg  Pulse 75  Temp(Src) 98.3 F (36.8 C) (Oral)  Resp 16  Ht 5\' 7"  (1.702 m)  Wt 150 lb (68.04 kg)  BMI 23.49 kg/m2  LMP 09/23/2014  EKG interpreted with Dr. Cleta Alberts: NSR  Results for orders placed or performed in visit on 10/04/14  POCT UA - Microscopic Only  Result Value Ref Range   WBC, Ur, HPF, POC Negative    RBC, urine, microscopic 0-2    Bacteria, U Microscopic Negative    Mucus, UA  Negative    Epithelial cells, urine per micros Negative    Crystals, Ur, HPF, POC Negative    Casts, Ur, LPF, POC Negative    Yeast, UA Negative   POCT urinalysis dipstick  Result Value Ref Range   Color, UA Light Yellow    Clarity, UA Clear    Glucose, UA Negative    Bilirubin, UA Negative    Ketones, UA Negative    Spec Grav, UA 1.010    Blood, UA Trace-lysed    pH, UA 7.5    Protein, UA Negative    Urobilinogen, UA 0.2    Nitrite, UA negative    Leukocytes, UA Negative Negative   Assessment and Plan:  1. Annual physical exam - CBC  2. Hashimoto's thyroiditis 3. Thyromegaly Synthroid refilled. TSH pending. Referred for thyroid u/s -- thyromegaly on u/s 1 year ago and Dr. Cleta Alberts suggested repeat in 6 months to ensure stable. Likely will not need again if remains stable and symptoms do not change. - TSH - levothyroxine (SYNTHROID, LEVOTHROID) 137 MCG tablet; Take 1 tablet (137 mcg total) by mouth daily.  Dispense: 90 tablet; Refill:  3 - US Soft Tissue Head/Neck; Future  4. Anxiety Stable on paxil. Meds refilled. - PARoxetine (PAXIL) 10 MG tablet; TAKE ONE TABLET BY MOUTH ONCE DAILY IN THE MORNING  Dispense: 90 tablet; Refill: 3  5. Palpitations EKG NSR. CMP and TSH pending. Pt does not want referral to cardiology at this point since episodes have been so infrequent and she has not had a repeat episode in 2.5 months. She will call if the palpitations recur and will refer to cardiology at that time. - EKG 12-Lead - Comprehensive metabolic panel - TSH  6. Stress incontinence We discussed lifestyle changes, importance of preventing constipation and kegel exercises. Provided with info on kegel exercises.  7. Hematuria She has had persistent microscopic hematuria on UA. Referred to urology. - POCT UA - Microscopic Only - POCT urinalysis dipstick - Ambulatory referral to Urology  8. Lipid screening - Lipid panel   Roswell Miners. Dyke Brackett, MHS [facility]  10/05/2014

## 2014-10-04 NOTE — Patient Instructions (Addendum)
Cut back to no more than 2 cups of coffee a day and never after 2 pm. You may need to cut back to 1 cup a day. Drink about 64 oz fluid a day, not a whole lot more though. Work on [employer]. Take miralax to prevent constipation, 1 cap a day in one of your morning beverages. You will get a phone call to schedule your thyroid ultrasound and urology appt If you get another episode of palpitations, let me know and I will refer you to cardiology. Schedule your mammogram when it is time. Return with problems.   Kegel Exercises The goal of Kegel exercises is to isolate and exercise your pelvic floor muscles. These muscles act as a hammock that supports the rectum, vagina, small intestine, and uterus. As the muscles weaken, the hammock sags and these organs are displaced from their normal positions. Kegel exercises can strengthen your pelvic floor muscles and help you to improve bladder and bowel control, improve sexual response, and help reduce many problems and some discomfort during pregnancy. Kegel exercises can be done anywhere and at any time. HOW TO PERFORM KEGEL EXERCISES 1. Locate your pelvic floor muscles. To do this, squeeze (contract) the muscles that you use when you try to stop the flow of urine. You will feel a tightness in the vaginal area (women) and a tight lift in the rectal area (men and women). 2. When you begin, contract your pelvic muscles tight for 2-5 seconds, then relax them for 2-5 seconds. This is one set. Do 4-5 sets with a short pause in between. 3. Contract your pelvic muscles for 8-10 seconds, then relax them for 8-10 seconds. Do 4-5 sets. If you cannot contract your pelvic muscles for 8-10 seconds, try 5-7 seconds and work your way up to 8-10 seconds. Your goal is 4-5 sets of 10 contractions each day. Keep your stomach, buttocks, and legs relaxed during the exercises. Perform sets of both short and long contractions. Vary your positions. Perform these contractions 3-4  times per day. Perform sets while you are:   Lying in bed in the morning.  Standing at lunch.  Sitting in the late afternoon.  Lying in bed at night. You should do 40-50 contractions per day. Do not perform more Kegel exercises per day than recommended. Overexercising can cause muscle fatigue. Continue these exercises for for at least 15-20 weeks or as directed by your caregiver. Document Released: 01/15/2012 Document Reviewed: 01/15/2012 [facility] Patient Information 2015 [place], [place]. This information is not intended to replace advice given to you by your health care provider. Make sure you discuss any questions you have with your health care provider.

## 2014-10-05 DIAGNOSIS — N393 Stress incontinence (female) (male): Secondary | ICD-10-CM | POA: Insufficient documentation

## 2014-10-05 DIAGNOSIS — E010 Iodine-deficiency related diffuse (endemic) goiter: Secondary | ICD-10-CM | POA: Insufficient documentation

## 2014-10-05 DIAGNOSIS — R319 Hematuria, unspecified: Secondary | ICD-10-CM | POA: Insufficient documentation

## 2014-10-05 DIAGNOSIS — R002 Palpitations: Secondary | ICD-10-CM | POA: Insufficient documentation

## 2014-10-07 ENCOUNTER — Ambulatory Visit
Admission: RE | Admit: 2014-10-07 | Discharge: 2014-10-07 | Disposition: A | Discharge status: Home / Self Care | Payer: No Typology Code available for payment source | Source: Ambulatory Visit | Attending: Physician Assistant | Admitting: Physician Assistant

## 2014-10-07 DIAGNOSIS — E063 Autoimmune thyroiditis: Secondary | ICD-10-CM

## 2014-10-10 ENCOUNTER — Telehealth: Payer: Self-pay | Admitting: Physician Assistant

## 2014-10-10 DIAGNOSIS — E039 Hypothyroidism, unspecified: Secondary | ICD-10-CM

## 2014-10-10 MED ORDER — LEVOTHYROXINE SODIUM 150 MCG PO TABS: 150.0000 ug | ORAL_TABLET | Freq: Every day | ORAL | Status: DC

## 2014-10-10 NOTE — Telephone Encounter (Signed)
Spoke with patient about lab results. No need for further thyroid ultrasound unless symptoms/thyroid exam changes. TSH high at 10 -- will increase synthroid to 150 mcg from 137 mcg. Return in 6-8 weeks for repeat TSH. Future order placed.

## 2014-11-02 ENCOUNTER — Encounter: Payer: Self-pay | Admitting: Emergency Medicine

## 2014-11-03 ENCOUNTER — Other Ambulatory Visit: Payer: Self-pay | Admitting: Emergency Medicine

## 2014-11-03 DIAGNOSIS — R000 Tachycardia, unspecified: Secondary | ICD-10-CM

## 2014-11-06 ENCOUNTER — Ambulatory Visit (INDEPENDENT_AMBULATORY_CARE_PROVIDER_SITE_OTHER): Payer: No Typology Code available for payment source | Admitting: Emergency Medicine

## 2014-11-06 ENCOUNTER — Encounter: Payer: Self-pay | Admitting: Emergency Medicine

## 2014-11-06 VITALS — BP 128/80 | HR 83 | Temp 98.2°F | Resp 17 | Ht 68.0 in | Wt 147.0 lb

## 2014-11-06 DIAGNOSIS — Z658 Other specified problems related to psychosocial circumstances: Secondary | ICD-10-CM | POA: Diagnosis not present

## 2014-11-06 DIAGNOSIS — E063 Autoimmune thyroiditis: Secondary | ICD-10-CM

## 2014-11-06 DIAGNOSIS — R002 Palpitations: Secondary | ICD-10-CM

## 2014-11-06 DIAGNOSIS — F439 Reaction to severe stress, unspecified: Secondary | ICD-10-CM

## 2014-11-06 MED ORDER — ALPRAZOLAM 0.25 MG PO TABS: ORAL_TABLET | ORAL | Status: DC

## 2014-11-06 NOTE — Progress Notes (Signed)
Patient ID: Kimberly Mooney, female   DOB: 04-11-1970, 44 y.o.   MRN: [id number]    This chart was scribed for Kimberly Lites, MD by [facility], medical scribe at [facility].The patient was seen in exam room 10 and the patient's care was started at 8:10 AM.  Chief Complaint:  Chief Complaint  Patient presents with  . flight anxiety  . Hypertension   HPI: Kimberly Mooney is a 44 y.o. female with a hx of HTN who reports to [facility] today complaining of heart palpations and anxiety. Appointment with Dr. Verdis Prime cardiology, Nov 9th. So far she has had 3 episodes of heart palpations, possible SVT. Last TSH and EKG one month ago. Stressed, and anxious due to flight. Taken xanax when she was younger. A nurse.  Past Medical History  Diagnosis Date  . GERD (gastroesophageal reflux disease)   . Thyroid disease     hypothyroidism  . Gallstones   . Depression   . Anxiety    Past Surgical History  Procedure Laterality Date  . Cholecystectomy     Social History   Social History  . Marital Status: Single    Spouse Name: N/A  . Number of Children: N/A  . Years of Education: N/A   Social History Main Topics  . Smoking status: Former Games developer  . Smokeless tobacco: None     Comment: smoked occasionally in college  . Alcohol Use: Yes     Comment: social  . Drug Use: No  . Sexual Activity: Yes    Birth Control/ Protection: IUD   Other Topics Concern  . None   Social History Narrative   Management consultant -  RN   Exercises   Significant Other         Family History  Problem Relation Age of Onset  . Cancer Mother     breast  . Hypertension Mother   . Osteoporosis Mother   . Hypertension Father    Allergies  Allergen Reactions  . Amoxicillin Diarrhea  . Augmentin [Amoxicillin-Pot Clavulanate] Diarrhea  . Codeine Nausea And Vomiting  . Doxycycline   . Penicillins     Diarrhea, severe abdominal pain  . Shellfish Allergy    Prior to Admission  medications   Medication Sig Start Date End Date Taking? Authorizing Provider  diphenhydrAMINE (BENADRYL) 25 MG tablet Take 25 mg by mouth every 8 (eight) hours as needed for sleep.   Yes Historical Provider, MD  lansoprazole (PREVACID) 15 MG capsule Take 15 mg by mouth daily.   Yes Historical Provider, MD  levothyroxine (SYNTHROID, LEVOTHROID) 150 MCG tablet Take 1 tablet (150 mcg total) by mouth daily. 10/10/14  Yes Lanier Clam V, PA-C  PARoxetine (PAXIL) 10 MG tablet TAKE ONE TABLET BY MOUTH ONCE DAILY IN THE MORNING 10/04/14  Yes Lanier Clam V, PA-C   ROS: The patient denies fevers, chills, night sweats, unintentional weight loss, chest pain, palpitations, wheezing, dyspnea on exertion, nausea, vomiting, abdominal pain, dysuria, hematuria, melena, numbness, weakness, or tingling.  All other systems have been reviewed and were otherwise negative with the exception of those mentioned in the HPI and as above.    PHYSICAL EXAM: Filed Vitals:   11/06/14 0807  BP: 128/80  Pulse: 83  Temp: 98.2 F (36.8 C)  Resp: 17  BP recheck- 116/78 Body mass index is 22.36 kg/(m^2).  General: Alert, no acute distress HEENT:  Normocephalic, atraumatic, oropharynx patent. Eye: Nonie Hoyer [facility] Cardiovascular:  Regular rate and  rhythm, no rubs murmurs or gallops.  No Carotid bruits, radial pulse intact. No pedal edema.  Respiratory: Clear to auscultation bilaterally.  No wheezes, rales, or rhonchi.  No cyanosis, no use of accessory musculature Abdominal: No organomegaly, abdomen is soft and non-tender, positive bowel sounds.  No masses. Musculoskeletal: Gait intact. No edema, tenderness Skin: No rashes, birth mark on the right side of her neck. Neurologic: Facial musculature symmetric. Psychiatric: Patient acts appropriately throughout our interaction. Lymphatic: No cervical or submandibular lymphadenopathy  LABS: Results for orders placed or performed in visit on 10/04/14  CBC  Result Value Ref Range     WBC 6.5 4.0 - 10.5 K/uL   RBC 4.51 3.87 - 5.11 MIL/uL   Hemoglobin 13.2 12.0 - 15.0 g/dL   HCT 81.1 91.4 - 78.2 %   MCV 88.5 78.0 - 100.0 fL   MCH 29.3 26.0 - 34.0 pg   MCHC 33.1 30.0 - 36.0 g/dL   RDW 95.6 21.3 - 08.6 %   Platelets 212 150 - 400 K/uL   MPV 12.2 8.6 - 12.4 fL  Comprehensive metabolic panel  Result Value Ref Range   Sodium 138 135 - 146 mmol/L   Potassium 3.8 3.5 - 5.3 mmol/L   Chloride 104 98 - 110 mmol/L   CO2 25 20 - 31 mmol/L   Glucose, Bld 97 65 - 99 mg/dL   BUN 12 7 - 25 mg/dL   Creat 5.78 4.69 - 6.29 mg/dL   Total Bilirubin 0.6 0.2 - 1.2 mg/dL   Alkaline Phosphatase 54 33 - 115 U/L   AST 19 10 - 30 U/L   ALT 14 6 - 29 U/L   Total Protein 6.9 6.1 - 8.1 g/dL   Albumin 4.3 3.6 - 5.1 g/dL   Calcium 9.3 8.6 - 52.8 mg/dL  Lipid panel  Result Value Ref Range   Cholesterol 174 125 - 200 mg/dL   Triglycerides 91 <413 mg/dL   HDL 60 >=24 mg/dL   Total CHOL/HDL Ratio 2.9 <=5.0 Ratio   VLDL 18 <30 mg/dL   LDL Cholesterol 96 <401 mg/dL  TSH  Result Value Ref Range   TSH 10.142 (H) 0.350 - 4.500 uIU/mL  POCT UA - Microscopic Only  Result Value Ref Range   WBC, Ur, HPF, POC Negative    RBC, urine, microscopic 0-2    Bacteria, U Microscopic Negative    Mucus, UA Negative    Epithelial cells, urine per micros Negative    Crystals, Ur, HPF, POC Negative    Casts, Ur, LPF, POC Negative    Yeast, UA Negative   POCT urinalysis dipstick  Result Value Ref Range   Color, UA Light Yellow    Clarity, UA Clear    Glucose, UA Negative    Bilirubin, UA Negative    Ketones, UA Negative    Spec Grav, UA 1.010    Blood, UA Trace-lysed    pH, UA 7.5    Protein, UA Negative    Urobilinogen, UA 0.2    Nitrite, UA negative    Leukocytes, UA Negative Negative   EKG/XRAY:   Primary read interpreted by [facility]. Cleta Alberts [facility]. Normal sinus rhythm  ASSESSMENT/PLAN: Referral artery placed to Dr. Garnette Scheuermann. Her baseline EKG is normal. She was given Xanax for upcoming  flight.I personally performed the services described in this documentation, which was scribed in my presence. The recorded information has been reviewed and is accurate. Gross sideeffects, risk and benefits, and alternatives of medications d/w  patient. Patient is aware that all medications have potential sideeffects and we are unable to predict every sideeffect or drug-drug interaction that may occur.  By signing my name below, I, Nadim Abuhashem, attest that this documentation has been prepared under the direction and in the presence of Kimberly Lites, MD.  Electronically Signed: Conchita Paris, medical scribe. 11/06/2014, 8:18 AM.  Lesle Chris MD 11/06/2014 8:18 AM

## 2014-11-06 NOTE — Patient Instructions (Signed)
Generalized Anxiety Disorder Generalized anxiety disorder (GAD) is a mental disorder. It interferes with life functions, including relationships, work, and school. GAD is different from normal anxiety, which everyone experiences at some point in their lives in response to specific life events and activities. Normal anxiety actually helps us prepare for and get through these life events and activities. Normal anxiety goes away after the event or activity is over.  GAD causes anxiety that is not necessarily related to specific events or activities. It also causes excess anxiety in proportion to specific events or activities. The anxiety associated with GAD is also difficult to control. GAD can vary from mild to severe. People with severe GAD can have intense waves of anxiety with physical symptoms (panic attacks).  SYMPTOMS The anxiety and worry associated with GAD are difficult to control. This anxiety and worry are related to many life events and activities and also occur more days than not for 6 months or longer. People with GAD also have three or more of the following symptoms (one or more in children):  Restlessness.   Fatigue.  Difficulty concentrating.   Irritability.  Muscle tension.  Difficulty sleeping or unsatisfying sleep. DIAGNOSIS GAD is diagnosed through an assessment by your health care provider. Your health care provider will ask you questions aboutyour mood,physical symptoms, and events in your life. Your health care provider may ask you about your medical history and use of alcohol or drugs, including prescription medicines. Your health care provider may also do a physical exam and blood tests. Certain medical conditions and the use of certain substances can cause symptoms similar to those associated with GAD. Your health care provider may refer you to a mental health specialist for further evaluation. TREATMENT The following therapies are usually used to treat GAD:    Medication. Antidepressant medication usually is prescribed for long-term daily control. Antianxiety medicines may be added in severe cases, especially when panic attacks occur.   Talk therapy (psychotherapy). Certain types of talk therapy can be helpful in treating GAD by providing support, education, and guidance. A form of talk therapy called cognitive behavioral therapy can teach you healthy ways to think about and react to daily life events and activities.  Stress managementtechniques. These include yoga, meditation, and exercise and can be very helpful when they are practiced regularly. A mental health specialist can help determine which treatment is best for you. Some people see improvement with one therapy. However, other people require a combination of therapies. Document Released: 05/25/2012 Document Revised: 06/14/2013 Document Reviewed: 05/25/2012 [employer] Patient Information 2015 [employer]. This information is not intended to replace advice given to you by your health care provider. Make sure you discuss any questions you have with your health care provider.  

## 2014-11-15 ENCOUNTER — Encounter: Payer: Self-pay | Admitting: Emergency Medicine

## 2014-12-06 ENCOUNTER — Other Ambulatory Visit: Payer: Self-pay

## 2014-12-06 DIAGNOSIS — Z1231 Encounter for screening mammogram for malignant neoplasm of breast: Secondary | ICD-10-CM

## 2014-12-09 ENCOUNTER — Other Ambulatory Visit (INDEPENDENT_AMBULATORY_CARE_PROVIDER_SITE_OTHER): Payer: 59 | Admitting: *Deleted

## 2014-12-09 DIAGNOSIS — E039 Hypothyroidism, unspecified: Secondary | ICD-10-CM

## 2014-12-09 LAB — TSH: TSH: 1.839 u[IU]/mL (ref 0.350–4.500)

## 2014-12-21 ENCOUNTER — Encounter: Payer: Self-pay | Admitting: Interventional Cardiology

## 2014-12-21 ENCOUNTER — Ambulatory Visit (INDEPENDENT_AMBULATORY_CARE_PROVIDER_SITE_OTHER): Payer: Managed Care, Other (non HMO) | Admitting: Interventional Cardiology

## 2014-12-21 VITALS — BP 114/70 | HR 72 | Ht 67.0 in | Wt 152.6 lb

## 2014-12-21 DIAGNOSIS — R002 Palpitations: Secondary | ICD-10-CM

## 2014-12-21 DIAGNOSIS — I471 Supraventricular tachycardia, unspecified: Secondary | ICD-10-CM

## 2014-12-21 NOTE — Patient Instructions (Signed)
Medication Instructions:  Your physician recommends that you continue on your current medications as directed. Please refer to the Current Medication list given to you today.   Labwork: None orderd  Testing/Procedures: Your physician has requested that you have an echocardiogram. Echocardiography is a painless test that uses sound waves to create images of your heart. It provides your doctor with information about the size and shape of your heart and how well your heart's chambers and valves are working. This procedure takes approximately one hour. There are no restrictions for this procedure.   Follow-Up: Your physician recommends that you schedule a follow-up appointment as needed   Any Other Special Instructions Will Be Listed Below (If Applicable).     If you need a refill on your cardiac medications before your next appointment, please call your pharmacy.

## 2014-12-21 NOTE — Progress Notes (Signed)
Cardiology Office Note   Date:  12/21/2014   ID:  Kimberly Mooney, DOB April 05, 1970, MRN [id number]  PCP:  Lucilla Edin, MD  Cardiologist:  Lesleigh Noe, MD   Chief Complaint  Patient presents with  . Tachycardia      History of Present Illness: Kimberly Mooney is a 44 y.o. female who presents for rapid heart beating.  9 month history of nocturnal tachycardia with heart rates greater than 160 bpm. 3 episodes, with the first occurring in March lasting nearly 10 hours intermittently. The second episode occurred in June and lasted 15 minutes before resolving spontaneously. The third episode occurred in September and lasted less than 15 minutes. It resolved after Valsalva. No associated chest pain. She feels palpitations. Her boyfriend is an emergency room physician and documented heart rates greater than 150. After Valsalva, there was a poor loss followed by regular stable heart rhythm. Each episode has awakened her from sleep.  She does state that when she extremely exerts herself, she has to stop because she feels as though her heart may "stop". This is a totally different feeling than with the tachycardia.  Prior history of palpitations that she feels have been PVCs that date back to her teenage years. This is never been substantiated by tracings.    Past Medical History  Diagnosis Date  . GERD (gastroesophageal reflux disease)   . Thyroid disease     hypothyroidism  . Gallstones   . Depression   . Anxiety     Past Surgical History  Procedure Laterality Date  . Cholecystectomy       Current Outpatient Prescriptions  Medication Sig Dispense Refill  . ALPRAZolam (XANAX) 0.25 MG tablet Take 0.25 mg by mouth every 6 (six) hours as needed (Flying anxiety).     Marland Kitchen diphenhydrAMINE (BENADRYL) 25 MG tablet Take 25 mg by mouth as needed for allergies or sleep.     . lansoprazole (PREVACID) 15 MG capsule Take 15 mg by mouth daily.    Marland Kitchen levothyroxine (SYNTHROID,  LEVOTHROID) 150 MCG tablet Take 1 tablet by mouth daily.    Marland Kitchen PARoxetine (PAXIL) 10 MG tablet Take 1 tablet by mouth daily.     No current facility-administered medications for this visit.    Allergies:   Shellfish allergy; Amoxicillin; Augmentin; Codeine; Doxycycline; Penicillins; and Shellfish-derived products    Social History:  The patient  reports that she has quit smoking. She has never used smokeless tobacco. She reports that she drinks alcohol. She reports that she does not use illicit drugs.   Family History:  The patient's family history includes Cancer in her mother; Hypertension in her father and mother; Osteoporosis in her mother.    ROS:  Please see the history of present illness.   Otherwise, review of systems are positive for occasional alcohol..   All other systems are reviewed and negative.    PHYSICAL EXAM: VS:  BP 114/70 mmHg  Pulse 72  Ht  (1.702 m)  Wt 69.219 kg (152 lb 9.6 oz)  BMI 23.89 kg/m2  SpO2 99% , BMI Body mass index is 23.89 kg/(m^2). GEN: Well nourished, well developed, in no acute distress HEENT: normal Neck: no JVD, carotid bruits, or masses Cardiac: RRR.  There is no murmur, rub, or gallop. There is no edema. Respiratory:  clear to auscultation bilaterally, normal work of breathing. GI: soft, nontender, nondistended, + BS MS: no deformity or atrophy Skin: warm and dry, no rash Neuro:  Strength and sensation  are intact Psych: euthymic mood, full affect   EKG:  EKG is not ordered today. The ekg from September is normal. No QT prolongation. No PR interval shortening. No evidence of preexcitation.   Recent Labs: 10/04/2014: ALT 14; BUN 12; Creat 0.93; Hemoglobin 13.2; Platelets 212; Potassium 3.8; Sodium 138 12/09/2014: TSH 1.839    Lipid Panel    Component Value Date/Time   CHOL 174 10/04/2014 1459   TRIG 91 10/04/2014 1459   HDL 60 10/04/2014 1459   CHOLHDL 2.9 10/04/2014 1459   VLDL 18 10/04/2014 1459   LDLCALC 96 10/04/2014  1459      Wt Readings from Last 3 Encounters:  12/21/14 69.219 kg (152 lb 9.6 oz)  11/06/14 66.679 kg (147 lb)  10/04/14 68.04 kg (150 lb)      Other studies Reviewed: Additional studies/ records that were reviewed today include: None other than notes from Dr. Cleta Albertsaub. The findings include simply the complaint of palpitations..    ASSESSMENT AND PLAN:  1. Palpitations Suspect PSVT.  Her boyfriend is an emergency room physician. Heart rates documented greater than 160 bpm. Episode resolved with Valsalva Twelve-lead EKG is normal    Current medicines are reviewed at length with the patient today.  The patient has the following concerns regarding medicines: None.  The following changes/actions have been instituted:    2-D Doppler echocardiogram  If episodes become more frequent consider monitoring.  If a recurrent prolonged episode occurs, she is cautioned to seek medical attention and have an EKG performed.  No limitations on physical activity are imposed at this time, unless physical activities associated with symptoms.  Labs/ tests ordered today include:   Orders Placed This Encounter  Procedures  . Echocardiogram     Disposition:   FU with HS in PRN Signed, Lesleigh NoeSMITH III,Linda Grimmer W, MD  12/21/2014 11:20 AM    [facility] [address], [place], [place]  [address] Phone: [phone]; Fax: [phone]

## 2014-12-26 ENCOUNTER — Ambulatory Visit (HOSPITAL_COMMUNITY): Payer: Managed Care, Other (non HMO) | Attending: Cardiovascular Disease

## 2014-12-26 ENCOUNTER — Other Ambulatory Visit: Payer: Self-pay

## 2014-12-26 DIAGNOSIS — I471 Supraventricular tachycardia: Secondary | ICD-10-CM | POA: Insufficient documentation

## 2014-12-26 DIAGNOSIS — I351 Nonrheumatic aortic (valve) insufficiency: Secondary | ICD-10-CM | POA: Insufficient documentation

## 2014-12-28 ENCOUNTER — Telehealth: Payer: Self-pay

## 2014-12-28 NOTE — Telephone Encounter (Signed)
-----   Message from Lyn RecordsHenry W Smith, MD sent at 12/26/2014  9:24 PM EST ----- Overall normal echo

## 2014-12-28 NOTE — Telephone Encounter (Signed)
Pt aware of echo results. Overall normal echo. Per Dr.Smith  If episodes become more frequent consider monitoring.  If a recurrent prolonged episode occurs, she is cautioned to seek medical attention and have an EKG performed.  Pt verbalized understanding.

## 2015-01-03 ENCOUNTER — Ambulatory Visit (INDEPENDENT_AMBULATORY_CARE_PROVIDER_SITE_OTHER): Payer: 59 | Admitting: Physician Assistant

## 2015-01-03 VITALS — BP 110/78 | HR 65 | Temp 97.9°F | Resp 18 | Ht 67.0 in | Wt 152.2 lb

## 2015-01-03 DIAGNOSIS — Z01419 Encounter for gynecological examination (general) (routine) without abnormal findings: Secondary | ICD-10-CM

## 2015-01-03 DIAGNOSIS — R002 Palpitations: Secondary | ICD-10-CM | POA: Diagnosis not present

## 2015-01-03 NOTE — Progress Notes (Signed)
[facility] [address], [place] [address] [phone]  Date:  01/03/2015   Name:  Kimberly GarbeWendy Snowball   DOB:  1970-12-04   MRN:  [id number]  PCP:  Lucilla EdinAUB, STEVE A, MD    Chief Complaint: Gynecologic Exam   History of Present Illness:  This is a 44 y.o. female with PMH hypothyroid, palpitations, stress incontinence, anxiety who is presenting for annual GYN exam. Last pap 11/2013 and normal. She was positive for HPV in 2008. She had normal colposcopy at that time and has been getting yearly paps since and all have been normal. She is sexually active with long term female partner. They had STD testing prior to becoming sexually active and all negative. She does not want STD testing today.  Pt was seen by cardio for eval palps - thought likely to be SVT. It has only happened three times in 9 months. They decided not to do monitor at this time.  Review of Systems:  Review of Systems See HPI  Patient Active Problem List   Diagnosis Date Noted  . Thyromegaly 10/05/2014  . Palpitations 10/05/2014  . Stress incontinence 10/05/2014  . Hematuria 10/05/2014  . Anxiety 01/10/2014  . Hashimoto's thyroiditis 10/20/2013  . Chronic cholecystitis with calculus 12/30/2011  . PLEVA (pityriasis lichenoides et varioliformis acuta) 12/10/2011    Prior to Admission medications   Medication Sig Start Date End Date Taking? Authorizing Provider  ALPRAZolam (XANAX) 0.25 MG tablet Take 0.25 mg by mouth every 6 (six) hours as needed (Flying anxiety).  11/06/14  Yes Historical Provider, MD  diphenhydrAMINE (BENADRYL) 25 MG tablet Take 25 mg by mouth as needed for allergies or sleep.    Yes Historical Provider, MD  lansoprazole (PREVACID) 15 MG capsule Take 15 mg by mouth daily.   Yes Historical Provider, MD  levothyroxine (SYNTHROID, LEVOTHROID) 150 MCG tablet Take 1 tablet by mouth daily. 10/10/14  Yes Historical Provider, MD  PARoxetine (PAXIL) 10 MG tablet Take 1 tablet by mouth  daily. 10/04/14  Yes Historical Provider, MD    Allergies  Allergen Reactions  . Shellfish Allergy Other (See Comments)    REACTION: "severe abdominal pain/diarreha"  . Amoxicillin Diarrhea  . Augmentin [Amoxicillin-Pot Clavulanate] Diarrhea  . Codeine Nausea And Vomiting  . Doxycycline Rash  . Penicillins Diarrhea and Nausea And Vomiting  . Shellfish-Derived Products Diarrhea    Past Surgical History  Procedure Laterality Date  . Cholecystectomy      Social History  Substance Use Topics  . Smoking status: Former Games developermoker  . Smokeless tobacco: Never Used     Comment: smoked occasionally in college  . Alcohol Use: 0.0 oz/week    0 Standard drinks or equivalent per week     Comment: social    Family History  Problem Relation Age of Onset  . Cancer Mother     breast  . Hypertension Mother   . Osteoporosis Mother   . Hypertension Father     Medication list has been reviewed and updated.  Physical Examination:  Physical Exam  Constitutional: She is oriented to person, place, and time. She appears well-developed and well-nourished. No distress.  HENT:  Head: Normocephalic and atraumatic.  Right Ear: Hearing normal.  Left Ear: Hearing normal.  Nose: Nose normal.  Eyes: Conjunctivae and lids are normal. Right eye exhibits no discharge. Left eye exhibits no discharge. No scleral icterus.  Pulmonary/Chest: Effort normal. No respiratory distress.  Genitourinary: Vagina normal and uterus normal. There is no  lesion on the right labia. There is no lesion on the left labia. Cervix exhibits no motion tenderness, no discharge and no friability. Right adnexum displays no tenderness and no fullness. Left adnexum displays no tenderness and no fullness. No vaginal discharge found.  Musculoskeletal: Normal range of motion.  Neurological: She is alert and oriented to person, place, and time.  Skin: Skin is warm, dry and intact. No lesion and no rash noted.  Psychiatric: She has a normal  mood and affect. Her speech is normal and behavior is normal. Thought content normal.   BP 110/78 mmHg  Pulse 65  Temp(Src) 97.9 F (36.6 C) (Oral)  Resp 18  Ht  (1.702 m)  Wt 152 lb 3.2 oz (69.037 kg)  BMI 23.83 kg/m2  SpO2 94%  LMP 12/16/2014  Assessment and Plan:  1. Encounter for routine gynecological examination Did pelvic exam only since yearly paps for past 7 years normal. Next pap 2018. Pelvic exam normal.  2. Palpitations Advised pt that if palps happen again, she should call cardiology office and get fit for holter monitor asap.   [place] Brackett, MHS [facility]  01/03/2015

## 2015-01-07 IMAGING — CR DG THORACIC SPINE 2V
2 series · 2 of 2 positions shown · non-contrast
Comparison: 12/10/2011

CLINICAL DATA: Upper back pain.

EXAM:
THORACIC SPINE - 2 VIEW

[lateral]
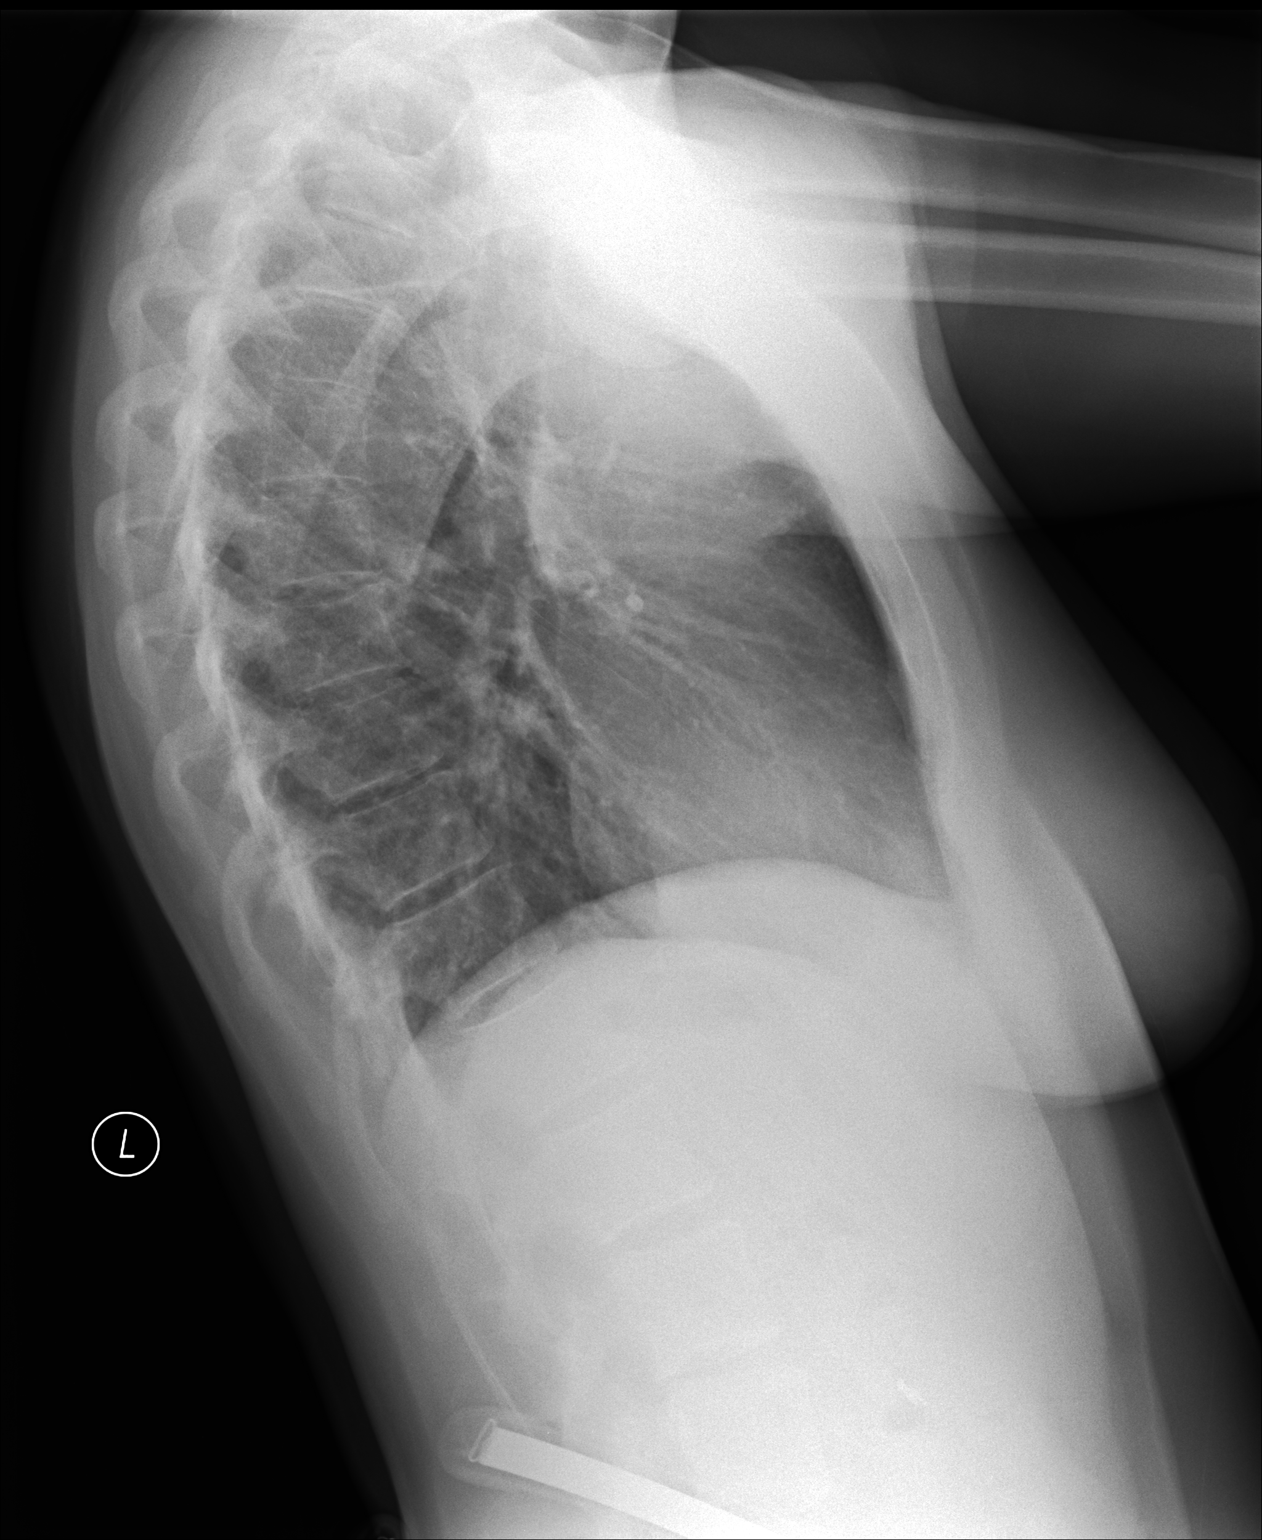

[AP]
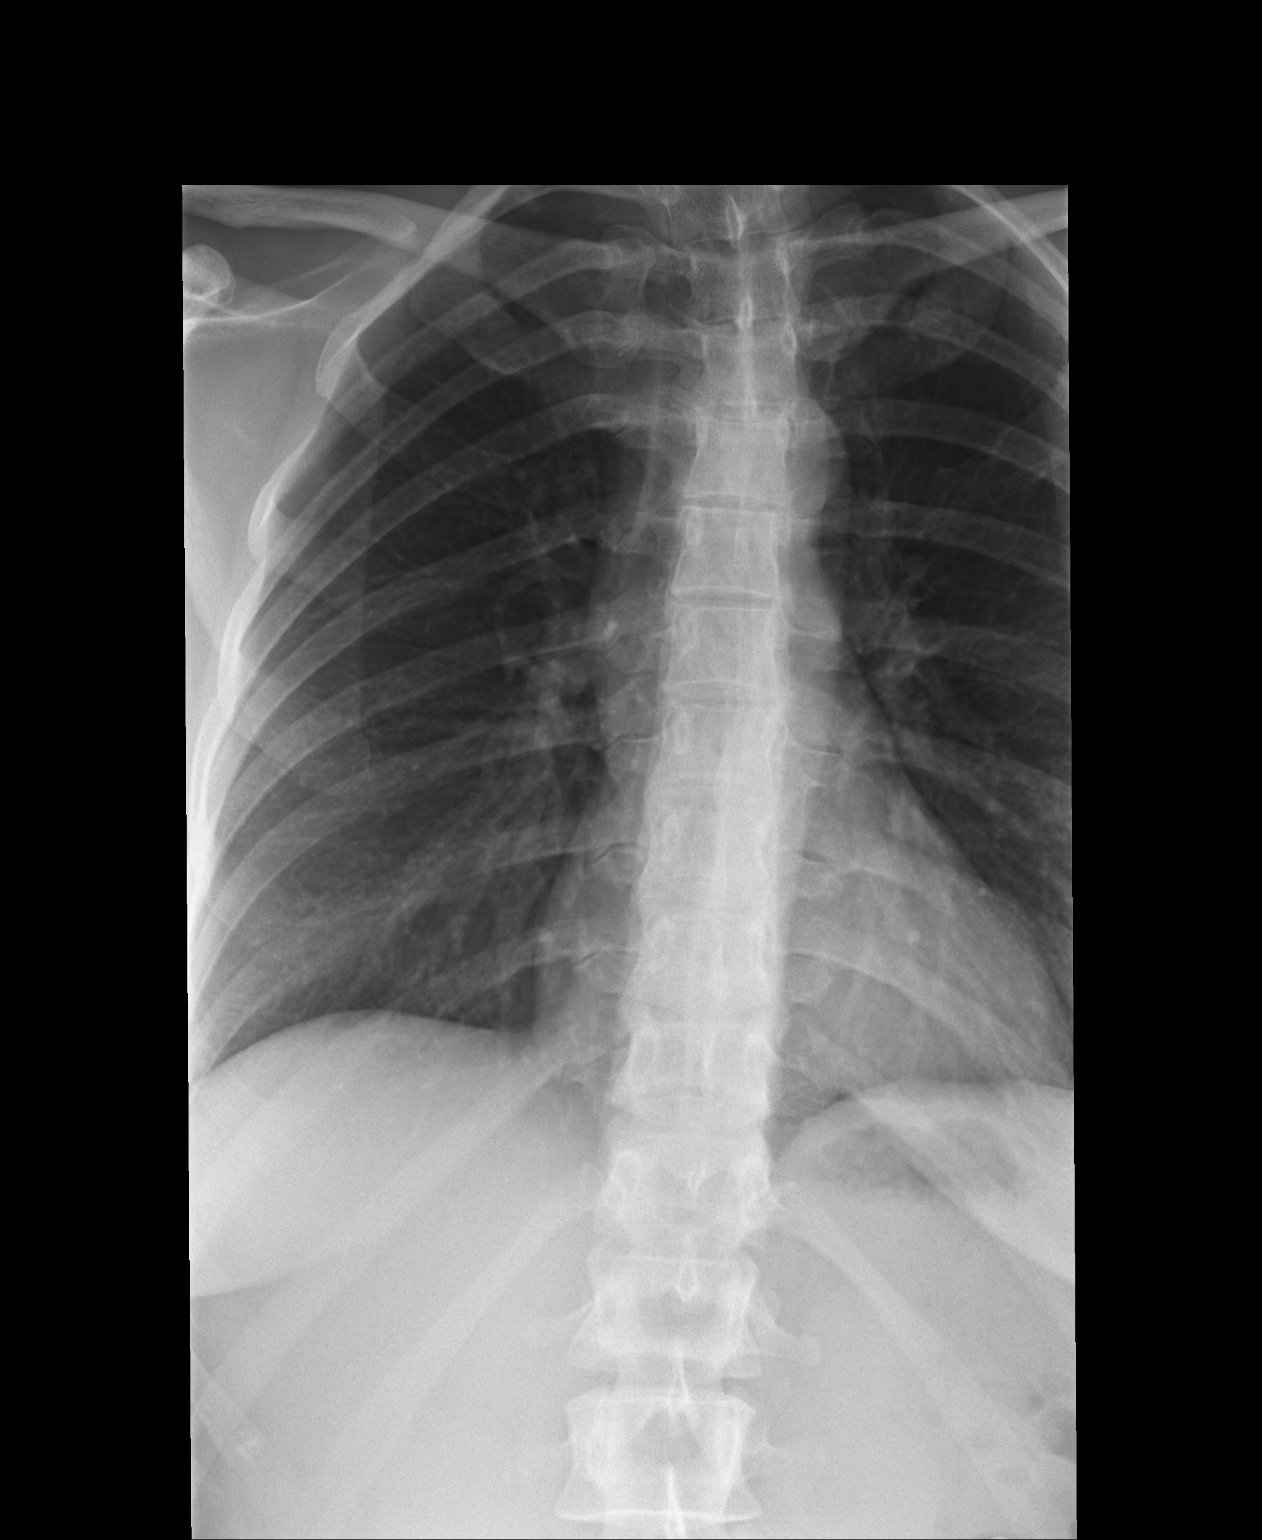

[2 of 2 positions shown; findings below may reference images not displayed]

FINDINGS: There is 11 degrees of levoconvex thoracic scoliosis as measured
between T2 and T5. Thoracic spine otherwise unremarkable.
IMPRESSION: 1. Mild levoconvex upper thoracic scoliosis.

## 2015-01-09 ENCOUNTER — Ambulatory Visit
Admission: RE | Admit: 2015-01-09 | Discharge: 2015-01-09 | Disposition: A | Discharge status: Home / Self Care | Payer: Managed Care, Other (non HMO) | Source: Ambulatory Visit

## 2015-01-09 DIAGNOSIS — Z1231 Encounter for screening mammogram for malignant neoplasm of breast: Secondary | ICD-10-CM

## 2015-01-11 ENCOUNTER — Ambulatory Visit
Admission: RE | Admit: 2015-01-11 | Discharge: 2015-01-11 | Disposition: A | Discharge status: Home / Self Care | Payer: Managed Care, Other (non HMO) | Source: Ambulatory Visit | Attending: Emergency Medicine | Admitting: Emergency Medicine

## 2015-01-11 ENCOUNTER — Other Ambulatory Visit: Payer: Self-pay | Admitting: Emergency Medicine

## 2015-01-11 DIAGNOSIS — R928 Other abnormal and inconclusive findings on diagnostic imaging of breast: Secondary | ICD-10-CM

## 2015-02-12 ENCOUNTER — Other Ambulatory Visit: Payer: Self-pay | Admitting: Internal Medicine

## 2015-03-09 ENCOUNTER — Encounter: Payer: Self-pay | Admitting: Emergency Medicine

## 2015-03-09 ENCOUNTER — Other Ambulatory Visit: Payer: Self-pay | Admitting: Emergency Medicine

## 2015-03-09 MED ORDER — ALPRAZOLAM 0.25 MG PO TABS: 0.2500 mg | ORAL_TABLET | Freq: Four times a day (QID) | ORAL | Status: DC | PRN

## 2015-03-10 ENCOUNTER — Ambulatory Visit: Payer: Self-pay | Admitting: Family Medicine

## 2015-07-28 ENCOUNTER — Ambulatory Visit (INDEPENDENT_AMBULATORY_CARE_PROVIDER_SITE_OTHER): Payer: 59 | Admitting: Emergency Medicine

## 2015-07-28 ENCOUNTER — Ambulatory Visit (INDEPENDENT_AMBULATORY_CARE_PROVIDER_SITE_OTHER): Payer: 59

## 2015-07-28 VITALS — BP 124/78 | HR 78 | Temp 98.5°F | Resp 17 | Ht 66.5 in | Wt 149.0 lb

## 2015-07-28 DIAGNOSIS — R51 Headache: Secondary | ICD-10-CM

## 2015-07-28 DIAGNOSIS — J0100 Acute maxillary sinusitis, unspecified: Secondary | ICD-10-CM

## 2015-07-28 DIAGNOSIS — M5010 Cervical disc disorder with radiculopathy, unspecified cervical region: Secondary | ICD-10-CM | POA: Diagnosis not present

## 2015-07-28 DIAGNOSIS — R519 Headache, unspecified: Secondary | ICD-10-CM

## 2015-07-28 MED ORDER — CEPHALEXIN 500 MG PO CAPS: 500.0000 mg | ORAL_CAPSULE | Freq: Two times a day (BID) | ORAL | Status: DC

## 2015-07-28 NOTE — Progress Notes (Addendum)
By signing my name below, I, Mesha Guinyard, attest that this documentation has been prepared under the direction and in the presence of Lesle ChrisSteven Najeeb Uptain, MD.  Electronically Signed: Arvilla MarketMesha Guinyard, Medical Scribe. 07/28/2015. 8:16 AM.  Chief Complaint:  Chief Complaint  Patient presents with  . Sinusitis  . wants referral    for neck     HPI: Kimberly Mooney is a 45 y.o. female who [facility] complaining of sinusitis. Pt would also like a referral for another provider for her neck. Pt has a left sided HA that radiates over her eye. Pt reports feeling unusually hot and cold, and weakness. Pts tooth that she had three root canals on is in pain. Pt went to that Urgent Care in [place] [facility] for [facility] symptoms and they thought she had a corneal abrasion and sinus infection. Pt states her face looked like she had been punched due to swelling. Pt denies fever .  Pt reports shoulder issues about a month ago. She received consult from a physician in thepast and would like to get another provider to give her a second opinion. Pt thinks the pain is in her neck and would like it looked at.  Past Medical History  Diagnosis Date  . GERD (gastroesophageal reflux disease)   . Thyroid disease     hypothyroidism  . Gallstones   . Depression   . Anxiety    Past Surgical History  Procedure Laterality Date  . Cholecystectomy     Social History   Social History  . Marital Status: Single    Spouse Name: N/A  . Number of Children: N/A  . Years of Education: N/A   Social History Main Topics  . Smoking status: Former Games developermoker  . Smokeless tobacco: Never Used     Comment: smoked occasionally in college  . Alcohol Use: 0.0 oz/week    0 Standard drinks or equivalent per week     Comment: social  . Drug Use: No  . Sexual Activity: Yes    Birth Control/ Protection: IUD   Other Topics Concern  . 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 Cancer Mother     breast  . Hypertension Mother   . Osteoporosis Mother   . Hypertension Father    Allergies  Allergen Reactions  . Shellfish Allergy Other (See Comments)    REACTION: "severe abdominal pain/diarreha"  . Amoxicillin Diarrhea  . Augmentin [Amoxicillin-Pot Clavulanate] Diarrhea  . Codeine Nausea And Vomiting  . Doxycycline Rash  . Penicillins Diarrhea and Nausea And Vomiting  . Shellfish-Derived Products Diarrhea   Prior to Admission medications   Medication Sig Start Date End Date Taking? Authorizing Provider  ALPRAZolam (XANAX) 0.25 MG tablet Take 1 tablet (0.25 mg total) by mouth every 6 (six) hours as needed (Flying anxiety). 03/09/15   Collene GobbleSteven A Breandan People, MD  diphenhydrAMINE (BENADRYL) 25 MG tablet Take 25 mg by mouth as needed for allergies or sleep.     Historical Provider, MD  lansoprazole (PREVACID) 15 MG capsule Take 15 mg by mouth daily.    Historical Provider, MD  levothyroxine (SYNTHROID, LEVOTHROID) 150 MCG tablet Take 1 tablet by mouth daily. 10/10/14   Historical Provider, MD  PARoxetine (PAXIL) 10 MG tablet Take 1 tablet by mouth daily. 10/04/14   Historical  Provider, MD     ROS: The patient denies fevers, chills, night sweats, unintentional weight loss, chest pain, palpitations, wheezing, dyspnea on exertion, nausea, vomiting, abdominal pain, dysuria, hematuria, melena, numbness,  or tingling. +weakness  All other systems have been reviewed and were otherwise negative with the exception of those mentioned in the HPI and as above.    PHYSICAL EXAM: Filed Vitals:   07/28/15 0813  BP: 124/78  Pulse: 78  Temp: 98.5 F (36.9 C)  Resp: 17   Body mass index is 23.69 kg/(m^2).   General: Alert, no acute distress HEENT:  Normocephalic, atraumatic, oropharynx patent. TM clear, Nose nl Eye: [facility], [facility] Cardiovascular:  Regular rate and rhythm, no rubs murmurs or gallops.  No  Carotid bruits, radial pulse intact. No pedal edema.  Respiratory: Clear to auscultation bilaterally.  No wheezes, rales, or rhonchi.  No cyanosis, no use of accessory musculature Abdominal: No organomegaly, abdomen is soft and non-tender, positive bowel sounds.  No masses. Musculoskeletal: Gait intact. No edema. Tenderness: left side of neck but motor strength left side nl  Skin: No rashes. Neurologic: Facial musculature symmetric. Psychiatric: Patient acts appropriately throughout our interaction. Lymphatic: No cervical or submandibular lymphadenopathy  LABS:    EKG/XRAY:   Primary read interpreted by Dr. Cleta Alberts at [facility].Dg Sinus 1-2 Views  07/28/2015  CLINICAL DATA:  Facial pain. EXAM: PARANASAL SINUSES - 1-2 VIEW COMPARISON:  None. FINDINGS: The paranasal sinus are symmetrically aerated. There is no evidence of sinus opacification air-fluid levels or mucosal thickening. No significant bone abnormalities are seen. IMPRESSION: No evidence of sinusitis. Electronically Signed   By: Marnee Spring M.D.   On: 07/28/2015 09:12     ASSESSMENT/PLAN: Patient will be treated as acute sinusitis. She will be on Keflex 500 twice a day. She has a penicillin allergy but it was GI symptoms not H Roux allergic reaction. Referral made to Dr. Venetia Maxon for evaluation of cervical radiculopathy.I personally performed the services described in this documentation, which was scribed in my presence. The recorded information has been reviewed and is accurate.   Gross sideeffects, risk and benefits, and alternatives of medications d/w patient. Patient is aware that all medications have potential sideeffects and we are unable to predict every sideeffect or drug-drug interaction that may occur.  Lesle Chris MD 07/28/2015 8:16 AM

## 2015-07-28 NOTE — Patient Instructions (Addendum)
   IF you received an x-ray today, you will receive an invoice from [facility]. Please contact [facility] at [phone] with questions or concerns regarding your invoice.   IF you received labwork today, you will receive an invoice from [facility]. Please contact [facility] at [phone] with questions or concerns regarding your invoice.   Our billing staff will not be able to assist you with questions regarding bills from these companies.  You will be contacted with the lab results as soon as they are available. The fastest way to get your results is to activate your My Chart account. Instructions are located on the last page of this paperwork. If you have not heard from us regarding the results in 2 weeks, please contact this office.    We recommend that you schedule a mammogram for breast cancer screening. Typically, you do not need a referral to do this. Please contact a local imaging center to schedule your mammogram.  [facility] - [phone]  *ask for the Radiology Department [facility]) - [phone] or [phone]  [facility] - [phone] [facility] - [phone] [facility] - [phone]  *ask for the Radiology Department [facility] - [phone]  *ask for the Radiology Department [facility] - [phone]  *ask for the Mammography Department [facility] - [phone] Sinusitis, Adult Sinusitis is redness, soreness, and inflammation of the paranasal sinuses. Paranasal sinuses are air pockets within the bones of your face. They are located beneath your eyes, in the middle of your forehead, and above your eyes. In healthy paranasal sinuses, mucus is able to drain out, and air is able to circulate through them by way of your nose. However, when your paranasal sinuses are inflamed, mucus and air can become  trapped. This can allow bacteria and other germs to grow and cause infection. Sinusitis can develop quickly and last only a short time (acute) or continue over a long period (chronic). Sinusitis that lasts for more than 12 weeks is considered chronic. CAUSES Causes of sinusitis include:  Allergies.  Structural abnormalities, such as displacement of the cartilage that separates your nostrils (deviated septum), which can decrease the air flow through your nose and sinuses and affect sinus drainage.  Functional abnormalities, such as when the small hairs (cilia) that line your sinuses and help remove mucus do not work properly or are not present. SIGNS AND SYMPTOMS Symptoms of acute and chronic sinusitis are the same. The primary symptoms are pain and pressure around the affected sinuses. Other symptoms include:  Upper toothache.  Earache.  Headache.  Bad breath.  Decreased sense of smell and taste.  A cough, which worsens when you are lying flat.  Fatigue.  Fever.  Thick drainage from your nose, which often is green and may contain pus (purulent).  Swelling and warmth over the affected sinuses. DIAGNOSIS Your health care provider will perform a physical exam. During your exam, your health care provider may perform any of the following to help determine if you have acute sinusitis or chronic sinusitis:  Look in your nose for signs of abnormal growths in your nostrils (nasal polyps).  Tap over the affected sinus to check for signs of infection.  View the inside of your sinuses using an imaging device that has a light attached (endoscope). If your health care provider suspects that you have chronic sinusitis, one or more of the following   tests may be recommended:  Allergy tests.  Nasal culture. A sample of mucus is taken from your nose, sent to a lab, and screened for bacteria.  Nasal cytology. A sample of mucus is taken from your nose and examined by your health care  provider to determine if your sinusitis is related to an allergy. TREATMENT Most cases of acute sinusitis are related to a viral infection and will resolve on their own within 10 days. Sometimes, medicines are prescribed to help relieve symptoms of both acute and chronic sinusitis. These may include pain medicines, decongestants, nasal steroid sprays, or saline sprays. However, for sinusitis related to a bacterial infection, your health care provider will prescribe antibiotic medicines. These are medicines that will help kill the bacteria causing the infection. Rarely, sinusitis is caused by a fungal infection. In these cases, your health care provider will prescribe antifungal medicine. For some cases of chronic sinusitis, surgery is needed. Generally, these are cases in which sinusitis recurs more than 3 times per year, despite other treatments. HOME CARE INSTRUCTIONS  Drink plenty of water. Water helps thin the mucus so your sinuses can drain more easily.  Use a humidifier.  Inhale steam 3-4 times a day (for example, sit in the bathroom with the shower running).  Apply a warm, moist washcloth to your face 3-4 times a day, or as directed by your health care provider.  Use saline nasal sprays to help moisten and clean your sinuses.  Take medicines only as directed by your health care provider.  If you were prescribed either an antibiotic or antifungal medicine, finish it all even if you start to feel better. SEEK IMMEDIATE MEDICAL CARE IF:  You have increasing pain or severe headaches.  You have nausea, vomiting, or drowsiness.  You have swelling around your face.  You have vision problems.  You have a stiff neck.  You have difficulty breathing.   This information is not intended to replace advice given to you by your health care provider. Make sure you discuss any questions you have with your health care provider.   Document Released: 01/28/2005 Document Revised: 02/18/2014  Document Reviewed: 02/12/2011 Elsevier Interactive Patient Education 2016 Elsevier Inc.  

## 2015-09-24 IMAGING — US US SOFT TISSUE HEAD/NECK
1 series · 14 of 25 positions shown · non-contrast
Comparison: None.

CLINICAL DATA: Palpable right nodule on physical exam.

EXAM:
THYROID ULTRASOUND
TECHNIQUE: Ultrasound examination of the thyroid gland and adjacent soft
tissues was performed.

[Series 1: us soft tissue head/neck · 0.06mm/px · 14 of 47 slices shown]
[im 1/47]
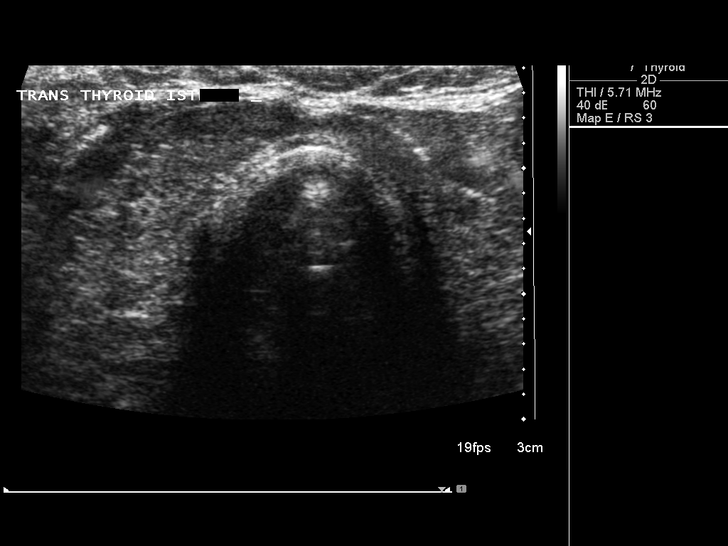
[im 4/47]
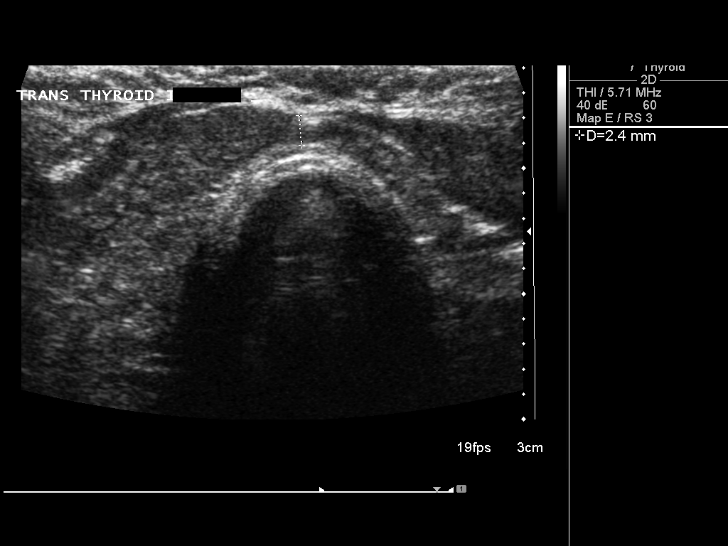
[im 8/47]
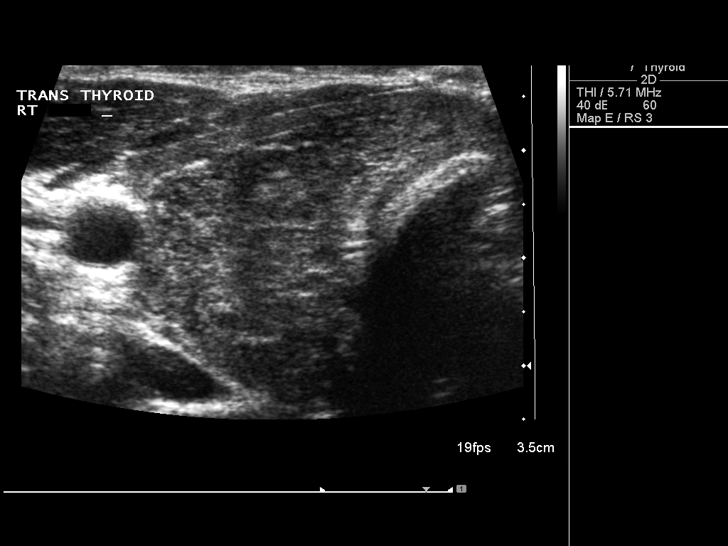
[im 12/47]
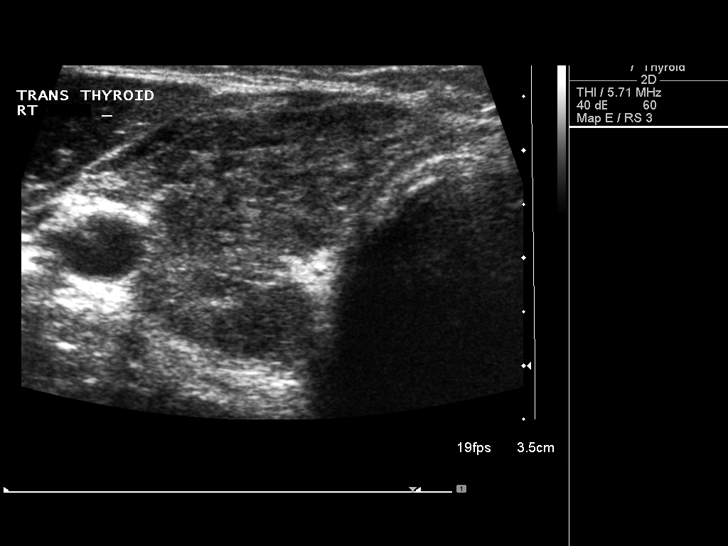
[im 16/47]
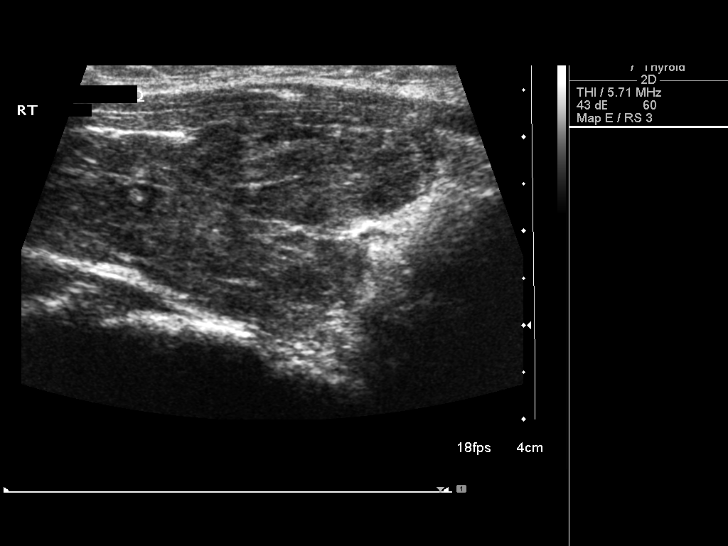
[im 18/47]
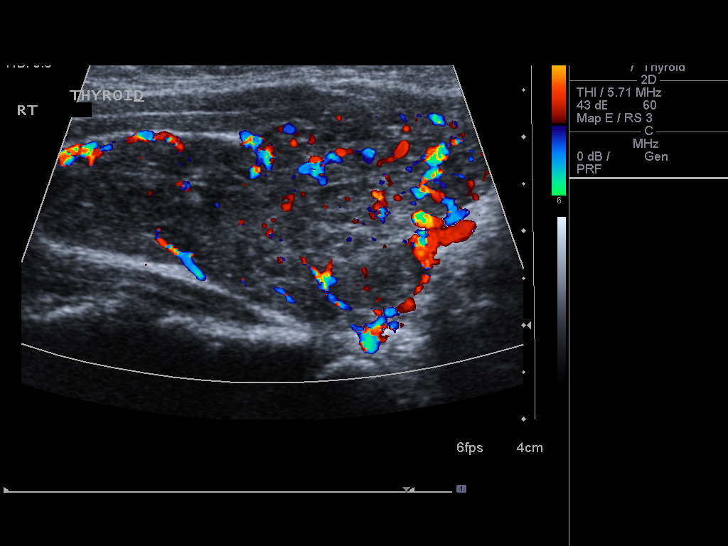
[im 22/47]
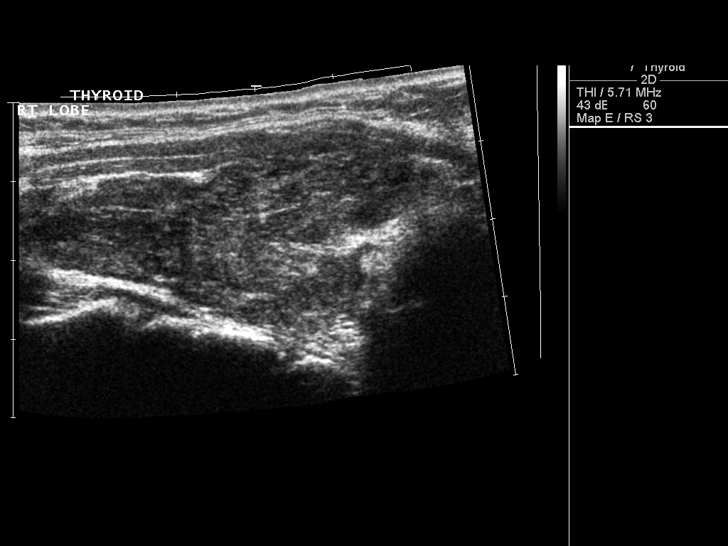
[im 25/47]
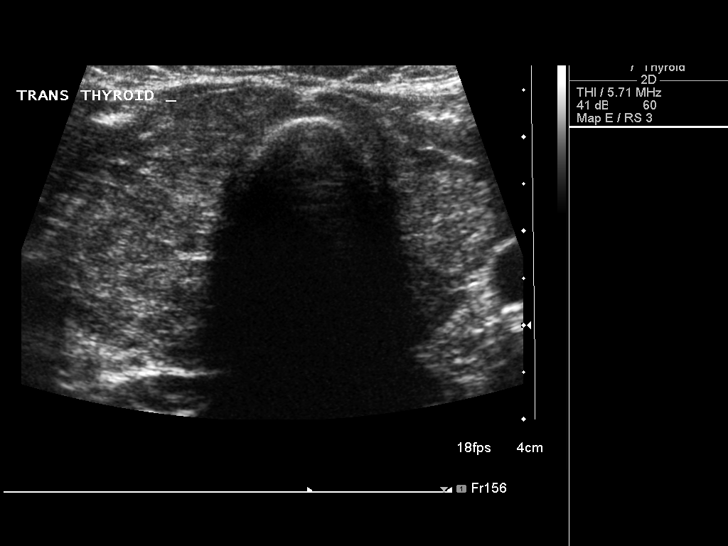
[im 29/47]
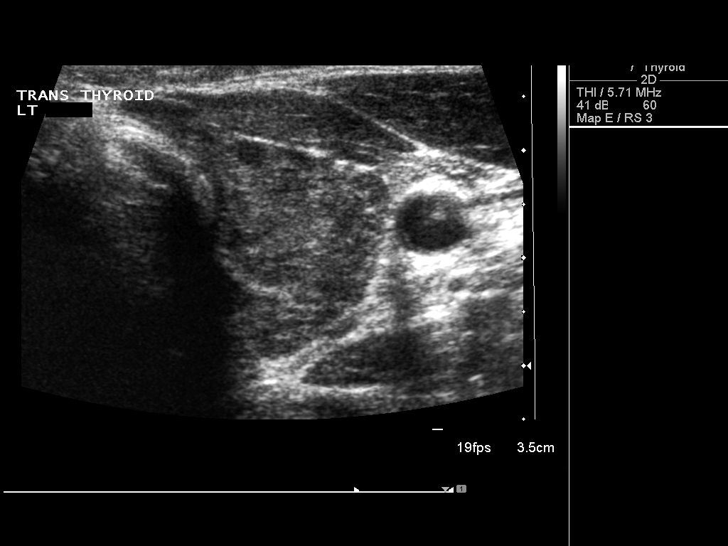
[im 31/47]
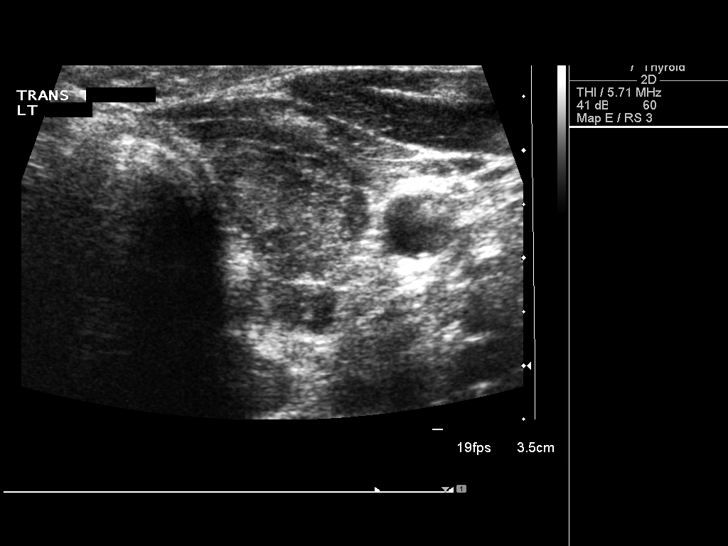
[im 35/47]
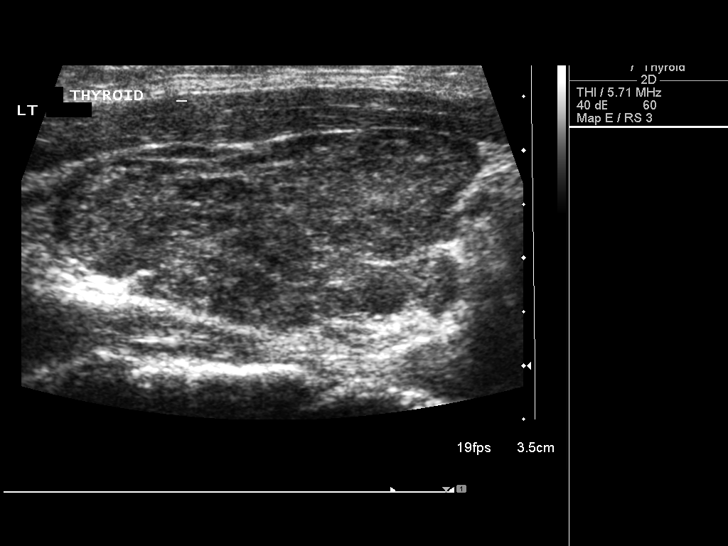
[im 39/47]
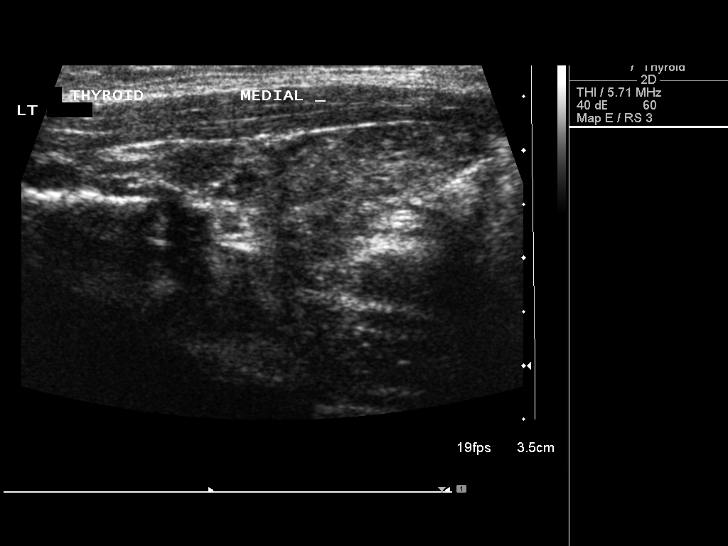
[im 43/47]
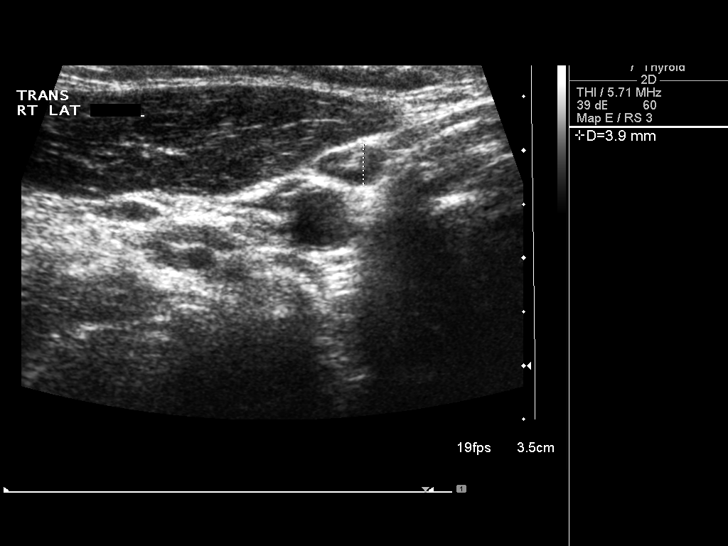
[im 47/47]
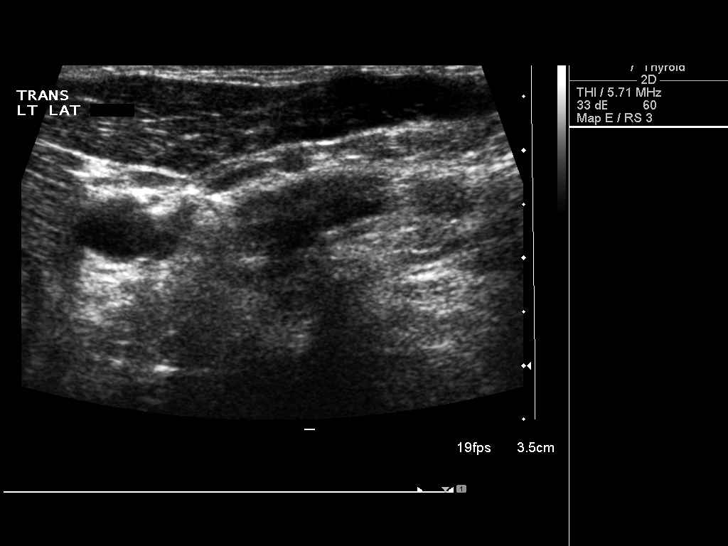

[14 of 25 positions shown; findings below may reference images not displayed]

FINDINGS: Right thyroid lobe

Measurements: 55 x 24 x 21 mm. Heterogeneous echotexture without
focal lesion.

Left thyroid lobe

Measurements: 40 x 17 x 17 mm.  Mild hyperemia.

Isthmus

Thickness: 2.4 mm.  No nodules visualized.

Lymphadenopathy

None visualized.
IMPRESSION: Thyromegaly with hyperemic inhomogeneous parenchyma, no discrete
nodule or dominant lesion.

## 2015-09-25 ENCOUNTER — Other Ambulatory Visit: Payer: Self-pay | Admitting: Neurosurgery

## 2015-09-25 DIAGNOSIS — M50222 Other cervical disc displacement at C5-C6 level: Secondary | ICD-10-CM

## 2015-09-30 ENCOUNTER — Ambulatory Visit
Admission: RE | Admit: 2015-09-30 | Discharge: 2015-09-30 | Disposition: A | Discharge status: Home / Self Care | Payer: Managed Care, Other (non HMO) | Source: Ambulatory Visit | Attending: Neurosurgery | Admitting: Neurosurgery

## 2015-09-30 DIAGNOSIS — M50222 Other cervical disc displacement at C5-C6 level: Secondary | ICD-10-CM

## 2015-10-03 ENCOUNTER — Ambulatory Visit (INDEPENDENT_AMBULATORY_CARE_PROVIDER_SITE_OTHER): Payer: Managed Care, Other (non HMO) | Admitting: Family Medicine

## 2015-10-03 ENCOUNTER — Encounter: Payer: Self-pay | Admitting: Family Medicine

## 2015-10-03 VITALS — BP 122/72 | HR 67 | Temp 98.7°F | Resp 17 | Ht 67.0 in | Wt 149.0 lb

## 2015-10-03 DIAGNOSIS — F419 Anxiety disorder, unspecified: Secondary | ICD-10-CM | POA: Diagnosis not present

## 2015-10-03 DIAGNOSIS — E063 Autoimmune thyroiditis: Secondary | ICD-10-CM

## 2015-10-03 DIAGNOSIS — N951 Menopausal and female climacteric states: Secondary | ICD-10-CM | POA: Diagnosis not present

## 2015-10-03 MED ORDER — LEVOTHYROXINE SODIUM 150 MCG PO TABS: 150.0000 ug | ORAL_TABLET | Freq: Every day | ORAL | 3 refills | Status: DC

## 2015-10-03 MED ORDER — PAROXETINE HCL 10 MG PO TABS: 10.0000 mg | ORAL_TABLET | Freq: Every day | ORAL | 3 refills | Status: DC

## 2015-10-03 NOTE — Patient Instructions (Addendum)
  Continue medications, will send letter if normal and call if abnormal.  Follow up in 1 year unless needed sooner.     IF you received an x-ray today, you will receive an invoice from [facility]. Please contact [facility] at [phone] with questions or concerns regarding your invoice.   IF you received labwork today, you will receive an invoice from [facility]. Please contact [facility] at [phone] with questions or concerns regarding your invoice.   Our billing staff will not be able to assist you with questions regarding bills from these companies.  You will be contacted with the lab results as soon as they are available. The fastest way to get your results is to activate your My Chart account. Instructions are located on the last page of this paperwork. If you have not heard from us regarding the results in 2 weeks, please contact this office.

## 2015-10-03 NOTE — Progress Notes (Signed)
  Kimberly GarbeWendy Mooney - 45 y.o. female MRN [id number]  Date of birth: 12/21/1970  SUBJECTIVE:  Including CC & ROS.  CC: Follow up Hashimoto's thyroiditis and anxiety  She is doing well on her thyroid medications.  Denies heat/cold intolerance, diarrhea, has mild constipation.  Has a history of palpitations thought to be PSVT, but has not had an episode since January.  Otherwise, no palpitations, chest pain, shortness of breath.  Doing well on Paxil and does not do well off of the medication.  She reports no side effects.  No depressive symptoms or anhedonia.  No SI/HI.    Mentions that only has menses every 4-8 weeks.  Mother went through menopause at 46 y/o.  No hot flashes, but did have some earlier.      ROS: No unexpected weight loss, fever, chills, chest pain, shortness of breath, diarrhea, numbness/tingling, redness, otherwise see HPI   PMHx - Updated and reviewed.  Contributory factors include: Hashimoto's and anxiety PSHx - Updated and reviewed.  Contributory factors include:  No history of thyroid surgery FHx - Updated and reviewed.  Contributory factors include:  Negative Social Hx - Updated and reviewed. Contributory factors include: Works as Engineer, civil (consulting)nurse Medications - reviewed and refilled  DATA REVIEWED: Last OV and TSH checks  PHYSICAL EXAM:  VS: BP:122/72  HR:67bpm  TEMP:98.7 F (37.1 C)(Oral)  RESP:100 %  HT:5\' 7"  (170.2 cm)   WT:149 lb (67.6 kg)  BMI:23.4 PHYSICAL EXAM: Gen: NAD, alert, cooperative with exam, well-appearing HEENT: clear conjunctiva,  CV:  RRR, no murmurs, gallops, rubs, no edema, capillary refill brisk Resp: non-labored, CTA b/l Skin: no rashes, normal turgor  Neuro: no gross deficits.  Psych:  alert and oriented Neck: thyroid with mild enlargement without nodules  ASSESSMENT & PLAN:   Hashimoto's thyroiditis TSH recheck.  Doing well on medications, will continue.  Follow up 1 year, or sooner if needed.  Anxiety Continue paxil.  No side effects  and doing well on medication. No depressive symptoms.  Follow up 1 year.    Perimenopausal Continue to monitor.  Do not believe [facility], can diagnose clinically.  Follow up as needed.  Recommended being seen if having bleeding after menopause has occurred.    Signed,  Corliss MarcusAlicia Barnes, DO [facility] 5:53 PM  10/03/15

## 2015-10-03 NOTE — Assessment & Plan Note (Signed)
Continue to monitor.  Do not believe need to [facility], can diagnose clinically.  Follow up as needed.  Recommended being seen if having bleeding after menopause has occurred.

## 2015-10-03 NOTE — Assessment & Plan Note (Signed)
TSH recheck.  Doing well on medications, will continue.  Follow up 1 year, or sooner if needed.

## 2015-10-03 NOTE — Assessment & Plan Note (Signed)
Continue paxil.  No side effects and doing well on medication. No depressive symptoms.  Follow up 1 year.

## 2015-10-04 ENCOUNTER — Other Ambulatory Visit: Payer: Self-pay

## 2015-10-04 LAB — TSH: TSH: 3.63 mIU/L

## 2015-10-04 NOTE — Progress Notes (Signed)
Patient discussed with Dr. Zachery DauerBarnes. Agree with findings, assessment and plan of care per her note.  Signed,   Meredith StaggersJeffrey Chyrl Elwell, MD [facility].  10/04/15 4:27 PM

## 2016-03-18 ENCOUNTER — Ambulatory Visit (INDEPENDENT_AMBULATORY_CARE_PROVIDER_SITE_OTHER): Payer: Managed Care, Other (non HMO) | Admitting: Family Medicine

## 2016-03-18 ENCOUNTER — Encounter: Payer: Self-pay | Admitting: Family Medicine

## 2016-03-18 VITALS — BP 124/78 | HR 71 | Temp 98.4°F | Resp 18 | Wt 152.0 lb

## 2016-03-18 DIAGNOSIS — H938X2 Other specified disorders of left ear: Secondary | ICD-10-CM | POA: Diagnosis not present

## 2016-03-18 DIAGNOSIS — S61209A Unspecified open wound of unspecified finger without damage to nail, initial encounter: Secondary | ICD-10-CM | POA: Diagnosis not present

## 2016-03-18 DIAGNOSIS — F419 Anxiety disorder, unspecified: Secondary | ICD-10-CM

## 2016-03-18 DIAGNOSIS — E063 Autoimmune thyroiditis: Secondary | ICD-10-CM

## 2016-03-18 LAB — TSH: TSH: 0.65 u[IU]/mL (ref 0.35–4.50)

## 2016-03-18 NOTE — Progress Notes (Addendum)
Subjective:    Patient ID: Kimberly Mooney, female    DOB: 08/20/1970, 46 y.o.   MRN: [id number]  HPI This is a 46 yo female who presents today to establish care. Depression and anxiety fairly well managed on Paxil . Has been on Xanax in past, uses mostly for travel, occasionally for anxiety. Is out (last filled 1/17, #30) but does not want refill today, will let me know if she needs refill. Sleeps ok, takes Melatonin most nights, rare benadryl 12.5 mg. Suspects perimenopause related. Periods irregular. Sexually active with boyfriend, he has had vasectomy. Sees Kimberly Mooney, WHNP at [facility], last visit 10/17.   Awoke with left eye redness yesterday morning. No pain. No drainage. Has occasional matting. Has chronic cyst left eye. Does not bother her. No recent strong cough or headaches. No visual changes.   Left ear has felt stopped up and she feels popping most days since a scuba diving trip 2 years ago. Does not feel that hearing is affected. Takes Sudafed occasionally. Not interested in trying Flonase. Some seasonal allergies.   Shut right forefinger in door last week. No redness or drainage. Has been keeping covered.   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 Work is ok. Has long term boyfriend. Has one teenage son.   Past Medical History:  Diagnosis Date  . Anxiety   . Depression   . Gallstones   . GERD (gastroesophageal reflux disease)   . Thyroid disease    hypothyroidism   Past Surgical History:  Procedure Laterality Date  . CHOLECYSTECTOMY     Family History  Problem Relation Age of Onset  . Cancer Mother     breast  . Hypertension Mother   . Osteoporosis Mother   . Hypertension Father    Social History  Substance Use Topics  . Smoking status: Never Smoker  . Smokeless tobacco: Never Used     Comment: smoked occasionally in college  . Alcohol use 0.0 oz/week     Comment: social      Review of Systems Per HPI    Objective:   Physical Exam    Constitutional: She is oriented to person, place, and time. She appears well-developed and well-nourished. No distress.  HENT:  Head: Normocephalic and atraumatic.  Right Ear: Tympanic membrane and ear canal normal.  Left Ear: External ear and ear canal normal.  Ears:  Gross hearing intact.   Eyes: Pupils are equal, round, and reactive to light. Right eye exhibits no discharge. Left eye exhibits no discharge. Left conjunctiva is injected (mild. left lower ). Right eye exhibits normal extraocular motion and no nystagmus. Left eye exhibits normal extraocular motion and no nystagmus.    Cardiovascular: Normal rate.   Pulmonary/Chest: Effort normal.  Neurological: She is alert and oriented to person, place, and time.  Skin: Skin is warm and dry. She is not diaphoretic.  Right index finger with 2 mm superficial laceration, slightly moist, no drainage, no erythema.   Psychiatric: She has a normal mood and affect. Her behavior is normal. Judgment and thought content normal.  Vitals reviewed.     BP 124/78 (BP Location: Left Arm, Patient Position: Sitting, Cuff Size: Normal)   Pulse 71   Temp 98.4 F (36.9 C) (Oral)   Resp 18   Wt 152 lb (68.9 kg)   LMP 03/15/2016   SpO2 100%   BMI 23.81 kg/m  Wt Readings from Last 3 Encounters:  03/18/16 152 lb (68.9 kg)  10/03/15 149 lb (  67.6 kg)  07/28/15 149 lb (67.6 kg)       Assessment & Plan:  1. Hashimoto's thyroiditis - TSH - would like to see TSH closer to 1, will adjust medication as needed  2. Pressure sensation in ear, left - given length and frequency of symptoms, recommend ENT referral, patient agreeable - Ambulatory referral to ENT  3. Ear popping, left - Ambulatory referral to ENT  4. Open wound of finger of right hand, initial encounter - no signs of infection, continue to clean with soap and water, leave open to air  5. Anxiety - managing well on Paxil, encouraged continued regular exercise - offered her refill of  alprazolam since she uses very sparingly, patient will hold off for now, but will let me know if she needs refill  Kimberly Reeeborah Hollye Pritt, FNP-BC  [facility] [place], [facility]  03/18/2016 10:52 AM

## 2016-03-18 NOTE — Patient Instructions (Signed)
Please let me know if you have any purulent drainage from your left eye or if you develop pain or worsening redness  Keep finger wound open to air as much as possible, clean with regular soap and water and pat dry. Please let me know if you develop any redness or drainage.   You will get called about an ENT appointment with Dr. Annalee GentaShoemaker  I will notify you of TSH results  Perimenopause Perimenopause is the time when your body begins to move into the menopause (no menstrual period for 12 straight months). It is a natural process. Perimenopause can begin 2-8 years before the menopause and usually lasts for 1 year after the menopause. During this time, your ovaries may or may not produce an egg. The ovaries vary in their production of estrogen and progesterone hormones each month. This can cause irregular menstrual periods, difficulty getting pregnant, vaginal bleeding between periods, and uncomfortable symptoms. CAUSES  Irregular production of the ovarian hormones, estrogen and progesterone, and not ovulating every month.  Other causes include:  Tumor of the pituitary gland in the brain.  Medical disease that affects the ovaries.  Radiation treatment.  Chemotherapy.  Unknown causes.  Heavy smoking and excessive alcohol intake can bring on perimenopause sooner. SIGNS AND SYMPTOMS   Hot flashes.  Night sweats.  Irregular menstrual periods.  Decreased sex drive.  Vaginal dryness.  Headaches.  Mood swings.  Depression.  Memory problems.  Irritability.  Tiredness.  Weight gain.  Trouble getting pregnant.  The beginning of losing bone cells (osteoporosis).  The beginning of hardening of the arteries (atherosclerosis). DIAGNOSIS  Your health care provider will make a diagnosis by analyzing your age, menstrual history, and symptoms. He or she will do a physical exam and note any changes in your body, especially your female organs. Female hormone tests may or may not  be helpful depending on the amount of female hormones you produce and when you produce them. However, other hormone tests may be helpful to rule out other problems. TREATMENT  In some cases, no treatment is needed. The decision on whether treatment is necessary during the perimenopause should be made by you and your health care provider based on how the symptoms are affecting you and your lifestyle. Various treatments are available, such as:  Treating individual symptoms with a specific medicine for that symptom.  Herbal medicines that can help specific symptoms.  Counseling.  Group therapy. HOME CARE INSTRUCTIONS   Keep track of your menstrual periods (when they occur, how heavy they are, how long between periods, and how long they last) as well as your symptoms and when they started.  Only take over-the-counter or prescription medicines as directed by your health care provider.  Sleep and rest.  Exercise.  Eat a diet that contains calcium (good for your bones) and soy (acts like the estrogen hormone).  Do not smoke.  Avoid alcoholic beverages.  Take vitamin supplements as recommended by your health care provider. Taking vitamin E may help in certain cases.  Take calcium and vitamin D supplements to help prevent bone loss.  Group therapy is sometimes helpful.  Acupuncture may help in some cases. SEEK MEDICAL CARE IF:   You have questions about any symptoms you are having.  You need a referral to a specialist (gynecologist, psychiatrist, or psychologist). SEEK IMMEDIATE MEDICAL CARE IF:   You have vaginal bleeding.  Your period lasts longer than 8 days.  Your periods are recurring sooner than 21 days.  You  have bleeding after intercourse.  You have severe depression.  You have pain when you urinate.  You have severe headaches.  You have vision problems. This information is not intended to replace advice given to you by your health care provider. Make sure you  discuss any questions you have with your health care provider. Document Released: 03/07/2004 Document Revised: 02/18/2014 Document Reviewed: 08/27/2012 Elsevier Interactive Patient Education  2017 [employer].

## 2016-03-18 NOTE — Progress Notes (Signed)
Pre visit review using our clinic review tool, if applicable. No additional management support is needed unless otherwise documented below in the visit note. 

## 2016-03-18 NOTE — Addendum Note (Signed)
Addended by: Olean ReeGESSNER, DEBORAH B on: 03/18/2016 01:15 PM   Modules accepted: Orders

## 2016-04-03 ENCOUNTER — Encounter: Payer: Self-pay | Admitting: Family Medicine

## 2016-04-03 ENCOUNTER — Ambulatory Visit (INDEPENDENT_AMBULATORY_CARE_PROVIDER_SITE_OTHER): Payer: Managed Care, Other (non HMO) | Admitting: Family Medicine

## 2016-04-03 VITALS — BP 132/80 | HR 78 | Temp 98.6°F | Resp 18 | Wt 149.4 lb

## 2016-04-03 DIAGNOSIS — J0100 Acute maxillary sinusitis, unspecified: Secondary | ICD-10-CM

## 2016-04-03 NOTE — Patient Instructions (Signed)
Please use sudafed 1x/ day to see if it will help with your congestion If not significantly better by Monday, please let me know.

## 2016-04-03 NOTE — Progress Notes (Signed)
Pre visit review using our clinic review tool, if applicable. No additional management support is needed unless otherwise documented below in the visit note. 

## 2016-04-03 NOTE — Progress Notes (Signed)
   Subjective:    Patient ID: Kimberly Mooney, female    DOB: March 02, 1970, 46 y.o.   MRN: [id number]  HPI This is a pleasant 46 yo female who presents today with two weeks of nasal congestion, fatigue. Started as a cold and laryngitis, developed sinus pressure and headache. Went to [facility] 5 days ago and was given Azithromycin, finished yesterday. Still feels really tired. Currently has headaches, nasal congestion, fatigue. Little drainage. Uses sinus rinse twice a day. Took Nordell? Sample (combination cold medication) with some relief. Got more relief with Advil cold and sinus. Never had muscle aches. No recent fever.    Past Medical History:  Diagnosis Date  . Anxiety   . Depression   . Gallstones   . GERD (gastroesophageal reflux disease)   . Thyroid disease    hypothyroidism   Past Surgical History:  Procedure Laterality Date  . CHOLECYSTECTOMY     Family History  Problem Relation Age of Onset  . Cancer Mother     breast  . Hypertension Mother   . Osteoporosis Mother   . Hypertension Father    Social History  Substance Use Topics  . Smoking status: Never Smoker  . Smokeless tobacco: Never Used     Comment: smoked occasionally in college  . Alcohol use 0.0 oz/week     Comment: social      Review of Systems     Objective:   Physical Exam  Constitutional: She is oriented to person, place, and time. She appears well-developed and well-nourished. No distress.  HENT:  Head: Normocephalic and atraumatic.  Right Ear: External ear normal.  Left Ear: External ear normal.  Nose: Septal deviation present. No mucosal edema. Right sinus exhibits no maxillary sinus tenderness and no frontal sinus tenderness. Left sinus exhibits no maxillary sinus tenderness and no frontal sinus tenderness.  Mouth/Throat: Oropharynx is clear and moist.  Eyes: Conjunctivae are normal.  Neck: Normal range of motion. Neck supple.  Cardiovascular: Normal rate, regular rhythm and normal  heart sounds.   Pulmonary/Chest: Effort normal and breath sounds normal. No respiratory distress. She has no wheezes. She has no rales.  Lymphadenopathy:    She has no cervical adenopathy.  Neurological: She is alert and oriented to person, place, and time.  Skin: Skin is warm and dry. She is not diaphoretic.  Psychiatric: She has a normal mood and affect. Her behavior is normal. Judgment and thought content normal.  Vitals reviewed.     BP 132/80 (BP Location: Right Arm, Patient Position: Sitting, Cuff Size: Normal)   Pulse 78   Temp 98.6 F (37 C) (Oral)   Resp 18   Wt 149 lb 6.4 oz (67.8 kg)   LMP 03/15/2016 Comment: irregular periods, perimenopause  SpO2 99%   BMI 23.40 kg/m  Wt Readings from Last 3 Encounters:  04/03/16 149 lb 6.4 oz (67.8 kg)  03/18/16 152 lb (68.9 kg)  10/03/15 149 lb (67.6 kg)       Assessment & Plan:  1. Acute non-recurrent maxillary sinusitis - she finished azithromyacin yesterday, still having effect - no concerning history or physical exam findings, reassured - symptomatic treatment with Advil cold and sinus, good fluid intake, continue saline rinses - F/U if no improvement in 5-7 days, sooner if worsening symptoms   Olean Reeeborah Bailee Thall, FNP-BC  [facility] [place], [facility]  04/03/2016 1:45 PM

## 2016-04-28 ENCOUNTER — Encounter: Payer: Self-pay | Admitting: Family Medicine

## 2016-04-29 ENCOUNTER — Other Ambulatory Visit: Payer: Self-pay | Admitting: Family Medicine

## 2016-04-29 MED ORDER — ALPRAZOLAM 0.25 MG PO TABS: 0.2500 mg | ORAL_TABLET | Freq: Two times a day (BID) | ORAL | 0 refills | Status: DC | PRN

## 2016-04-29 NOTE — Progress Notes (Signed)
Prescription called in to pharmacy

## 2016-08-29 ENCOUNTER — Ambulatory Visit (INDEPENDENT_AMBULATORY_CARE_PROVIDER_SITE_OTHER): Payer: Managed Care, Other (non HMO)

## 2016-08-29 ENCOUNTER — Encounter: Payer: Self-pay | Admitting: Physician Assistant

## 2016-08-29 ENCOUNTER — Ambulatory Visit (INDEPENDENT_AMBULATORY_CARE_PROVIDER_SITE_OTHER): Payer: Managed Care, Other (non HMO) | Admitting: Physician Assistant

## 2016-08-29 ENCOUNTER — Telehealth: Payer: Self-pay | Admitting: Family Medicine

## 2016-08-29 VITALS — BP 130/82 | HR 74 | Temp 98.6°F | Ht 67.0 in | Wt 150.2 lb

## 2016-08-29 DIAGNOSIS — M25512 Pain in left shoulder: Secondary | ICD-10-CM | POA: Diagnosis not present

## 2016-08-29 DIAGNOSIS — R0781 Pleurodynia: Secondary | ICD-10-CM

## 2016-08-29 DIAGNOSIS — M898X1 Other specified disorders of bone, shoulder: Secondary | ICD-10-CM | POA: Diagnosis not present

## 2016-08-29 LAB — CBC WITH DIFFERENTIAL/PLATELET
BASOS ABS: 0 10*3/uL (ref 0.0–0.1)
Basophils Relative: 0.5 % (ref 0.0–3.0)
EOS ABS: 0.1 10*3/uL (ref 0.0–0.7)
Eosinophils Relative: 1.6 % (ref 0.0–5.0)
HEMATOCRIT: 41.7 % (ref 36.0–46.0)
HEMOGLOBIN: 13.8 g/dL (ref 12.0–15.0)
LYMPHS PCT: 36.3 % (ref 12.0–46.0)
Lymphs Abs: 2.5 10*3/uL (ref 0.7–4.0)
MCHC: 33.2 g/dL (ref 30.0–36.0)
MCV: 90.9 fl (ref 78.0–100.0)
MONO ABS: 0.4 10*3/uL (ref 0.1–1.0)
Monocytes Relative: 5.1 % (ref 3.0–12.0)
Neutro Abs: 3.9 10*3/uL (ref 1.4–7.7)
Neutrophils Relative %: 56.5 % (ref 43.0–77.0)
Platelets: 233 10*3/uL (ref 150.0–400.0)
RBC: 4.58 Mil/uL (ref 3.87–5.11)
RDW: 12.8 % (ref 11.5–15.5)
WBC: 6.9 10*3/uL (ref 4.0–10.5)

## 2016-08-29 LAB — COMPREHENSIVE METABOLIC PANEL
ALBUMIN: 4.5 g/dL (ref 3.5–5.2)
ALK PHOS: 44 U/L (ref 39–117)
ALT: 13 U/L (ref 0–35)
AST: 14 U/L (ref 0–37)
BILIRUBIN TOTAL: 0.4 mg/dL (ref 0.2–1.2)
BUN: 18 mg/dL (ref 6–23)
CO2: 31 mEq/L (ref 19–32)
CREATININE: 1.09 mg/dL (ref 0.40–1.20)
Calcium: 9.6 mg/dL (ref 8.4–10.5)
Chloride: 104 mEq/L (ref 96–112)
GFR: 57.21 mL/min — ABNORMAL LOW (ref >60.00)
Glucose, Bld: 91 mg/dL (ref 70–99)
Potassium: 3.9 mEq/L (ref 3.5–5.1)
SODIUM: 139 meq/L (ref 135–145)
TOTAL PROTEIN: 7.1 g/dL (ref 6.0–8.3)

## 2016-08-29 NOTE — Telephone Encounter (Signed)
Noted  

## 2016-08-29 NOTE — Telephone Encounter (Signed)
Patient of Leone PayorGessner. I sched accute visit today 11:00am with Bufford ButtnerWorley. Larey SeatFell a month ago, pain in left rib and back pain.  Thank you,  -LL

## 2016-08-29 NOTE — Patient Instructions (Signed)
It was great to meet you!  We will contact you when your labs return.  If severe shortness of breath --> please go to the ER.  Follow-up if symptoms worsen or persist.   Please make an appointment with Deboraha Sprangebbie Gessner in 1 month for thyroid follow-up.

## 2016-08-29 NOTE — Progress Notes (Addendum)
Kimberly Mooney is a 46 y.o. female here for Left rib pain x 1 month.  I acted as a Neurosurgeonscribe for Energy East CorporationSamantha Felicia Both, PA-C Corky Mullonna Orphanos, LPN  History of Present Illness:   Chief Complaint  Patient presents with  . Left rib pain    fell one month ago    Injury  Incident onset: Pt fell one month ago. The incident occurred at home. The injury mechanism was a fall. The injury occurred in the context of other (Was carring a picture from the closet and got foot stuck in the handle of a shopping bag). No protective equipment was used. There is an injury to the chest (Left rib cage area, radiates to back). The pain is mild. Pertinent negatives include no abdominal pain, chest pain, difficulty breathing, headaches, light-headedness or weakness. There have been no prior injuries to these areas. Her tetanus status is UTD.   She denies any bruising, redness or rashes since injury onset. She denies any sudden onset SOB, however she does report that her son noticed, not soon after her initial injury, that she was breathing heavy while she was sitting down with him. This has resolved. 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 The pain has been traveling to her L scapula and causing intermittent pain. She does not have a gallbladder.  Past Medical History:  Diagnosis Date  . Anxiety   . Depression   . Gallstones   . GERD (gastroesophageal reflux disease)   . Thyroid disease    hypothyroidism     Social History   Social History  . Marital status: Single    Spouse name: N/A  . Number of children: N/A  . Years of education: N/A   Occupational History  . Not on file.   Social History Main Topics  . Smoking status: Never Smoker  . Smokeless tobacco: Never Used     Comment: smoked occasionally in college  . Alcohol use 0.0 oz/week     Comment:  social  . Drug use: No  . Sexual activity: Yes    Birth control/ protection: Other-see comments   Other Topics Concern  . 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 CHOLECYSTECTOMY      Family History  Problem Relation Age of Onset  . Cancer Mother        breast  . Hypertension Mother   . Osteoporosis Mother   . Hypertension Father     Allergies  Allergen Reactions  . Shellfish Allergy Other (See Comments)    REACTION: "severe abdominal pain/diarreha"  . Amoxicillin Diarrhea  . Augmentin [Amoxicillin-Pot Clavulanate] Diarrhea  . Codeine Nausea And Vomiting  . Doxycycline Rash  . Penicillins Diarrhea and Nausea And Vomiting  . Shellfish-Derived Products Diarrhea    Current Medications:   Current Outpatient Prescriptions:  .  acetaminophen (TYLENOL) 160 mg/5 mL SOLN, Take by mouth., Disp: , Rfl:  .  ALPRAZolam (XANAX) 0.25 MG tablet, Take 1 tablet (0.25 mg total) by mouth 2 (two) times daily as needed (Flying anxiety)., Disp: 30 tablet, Rfl: 0 .  diphenhydrAMINE (BENADRYL) 25 MG tablet, Take by mouth., Disp: ,  Rfl:  .  ibuprofen (ADVIL,MOTRIN) 200 MG tablet, Take 400 mg by mouth as needed., Disp: , Rfl:  .  lansoprazole (PREVACID) 15 MG capsule, Take by mouth., Disp: , Rfl:  .  levothyroxine (SYNTHROID, LEVOTHROID) 150 MCG tablet, Take 1 tablet (150 mcg total) by mouth daily., Disp: 90 tablet, Rfl: 3 .  PARoxetine (PAXIL) 10 MG tablet, Take 1 tablet (10 mg total) by mouth daily., Disp: 90 tablet, Rfl: 3 .  PARoxetine (PAXIL) 10 MG tablet, Take by mouth., Disp: , Rfl:  .  polyethylene glycol (MIRALAX / GLYCOLAX) packet, Take 17 g by mouth daily., Disp: , Rfl:    Review of Systems:   Review of Systems  Constitutional: Negative for chills, fever and weight loss.  Respiratory: Negative for sputum production.   Cardiovascular: Negative for chest  pain.  Gastrointestinal: Negative for abdominal pain.  Musculoskeletal: Positive for back pain.  Neurological: Negative for dizziness, weakness, light-headedness and headaches.  Psychiatric/Behavioral: The patient is nervous/anxious.     Vitals:   Vitals:   08/29/16 1114  BP: 130/82  Pulse: 74  Temp: 98.6 F (37 C)  TempSrc: Oral  SpO2: 99%  Weight: 150 lb 4 oz (68.2 kg)  Height: 5\' 7"  (1.702 m)     Body mass index is 23.53 kg/m.  Physical Exam:   Physical Exam  Constitutional: She appears well-developed. She is cooperative.  Non-toxic appearance. She does not have a sickly appearance. She does not appear ill. No distress.  Cardiovascular: Normal rate, regular rhythm, S1 normal, S2 normal, normal heart sounds and normal pulses.   No LE edema  Pulmonary/Chest: Effort normal and breath sounds normal.  Musculoskeletal:  Tenderness with deep palpation to lower L anterior rib cage,lower L posterior rib cage and L scapula. No rashes, lesions, or ecchymosis present. No decreased ROM or deformities seen on exam.  Neurological: She is alert. GCS eye subscore is 4. GCS verbal subscore is 5. GCS motor subscore is 6.  Skin: Skin is warm, dry and intact.  Psychiatric: She has a normal mood and affect. Her speech is normal and behavior is normal.  Nursing note and vitals reviewed.  CLINICAL DATA:  Left rib pain.  Fall 1 month ago  EXAM: LEFT RIBS AND CHEST - 3+ VIEW  COMPARISON:  08/24/2013  FINDINGS: No fracture or other bone lesions are seen involving the ribs. There is no evidence of pneumothorax or pleural effusion. Mooney lungs are clear. Heart size and mediastinal contours are within normal limits.  IMPRESSION: Negative.   Electronically Signed   By: Marlan Palau M.D.   On: 08/29/2016 14:46   Results for orders placed or performed in visit on 08/29/16  CBC with Differential/Platelet  Result Value Ref Range   WBC 6.9 4.0 - 10.5 K/uL   RBC 4.58 3.87 - 5.11  Mil/uL   Hemoglobin 13.8 12.0 - 15.0 g/dL   HCT 16.1 09.6 - 04.5 %   MCV 90.9 78.0 - 100.0 fl   MCHC 33.2 30.0 - 36.0 g/dL   RDW 40.9 81.1 - 91.4 %   Platelets 233.0 150.0 - 400.0 K/uL   Neutrophils Relative % 56.5 43.0 - 77.0 %   Lymphocytes Relative 36.3 12.0 - 46.0 %   Monocytes Relative 5.1 3.0 - 12.0 %   Eosinophils Relative 1.6 0.0 - 5.0 %   Basophils Relative 0.5 0.0 - 3.0 %   Neutro Abs 3.9 1.4 - 7.7 K/uL   Lymphs Abs 2.5 0.7 - 4.0 K/uL  Monocytes Absolute 0.4 0.1 - 1.0 K/uL   Eosinophils Absolute 0.1 0.0 - 0.7 K/uL   Basophils Absolute 0.0 0.0 - 0.1 K/uL  Comprehensive metabolic panel  Result Value Ref Range   Sodium 139 135 - 145 mEq/L   Potassium 3.9 3.5 - 5.1 mEq/L   Chloride 104 96 - 112 mEq/L   CO2 31 19 - 32 mEq/L   Glucose, Bld 91 70 - 99 mg/dL   BUN 18 6 - 23 mg/dL   Creatinine, Ser 9.60 0.40 - 1.20 mg/dL   Total Bilirubin 0.4 0.2 - 1.2 mg/dL   Alkaline Phosphatase 44 39 - 117 U/L   AST 14 0 - 37 U/L   ALT 13 0 - 35 U/L   Total Protein 7.1 6.0 - 8.3 g/dL   Albumin 4.5 3.5 - 5.2 g/dL   Calcium 9.6 8.4 - 45.4 mg/dL   GFR 09.81 (L) >19.14 mL/min    Assessment and Plan:    Wessie was seen today for left rib pain.  Diagnoses and all orders for this visit:  Rib pain on left side and Pain in scapula Rib and chest xray is normal. Labs are unremarkable, does have a slight dip in GFR but will encourage adequate hydration and follow-up with PCP. She declines any additional medication for her pain, she admits that she is here for reassurance only. If symptoms persist, to be evaluated by PCP. -     CBC with Differential/Platelet -     Comprehensive metabolic panel -     DG Ribs Unilateral W/Chest Left; Future  *Additionally she is asking for her thyroid levels to be checked and meds to be refilled. I recommended that she see her PCP in 1 month for follow-up of this, as it will be 59-months after previous TSH check. She verbalized agreement. If any medication  refills needed, please contact PCP. Our records reveal she should have enough until end of Aug.  . Reviewed expectations re: course of current medical issues. . Discussed self-management of symptoms. . Outlined signs and symptoms indicating need for more acute intervention. . Patient verbalized understanding and all questions were answered. . See orders for this visit as documented in the electronic medical record. . Patient received an After-Visit Summary.  CMA or LPN served as scribe during this visit. History, Physical, and Plan performed by medical provider. Documentation and orders reviewed and attested to.  Jarold Motto, PA-C

## 2016-09-20 ENCOUNTER — Encounter: Payer: Self-pay | Admitting: Family Medicine

## 2016-09-20 ENCOUNTER — Ambulatory Visit (INDEPENDENT_AMBULATORY_CARE_PROVIDER_SITE_OTHER): Payer: Managed Care, Other (non HMO) | Admitting: Family Medicine

## 2016-09-20 VITALS — BP 116/78 | HR 86 | Ht 66.5 in | Wt 150.0 lb

## 2016-09-20 DIAGNOSIS — F419 Anxiety disorder, unspecified: Secondary | ICD-10-CM | POA: Diagnosis not present

## 2016-09-20 DIAGNOSIS — R944 Abnormal results of kidney function studies: Secondary | ICD-10-CM | POA: Diagnosis not present

## 2016-09-20 DIAGNOSIS — E063 Autoimmune thyroiditis: Secondary | ICD-10-CM | POA: Diagnosis not present

## 2016-09-20 DIAGNOSIS — E038 Other specified hypothyroidism: Secondary | ICD-10-CM | POA: Diagnosis not present

## 2016-09-20 LAB — BASIC METABOLIC PANEL
BUN: 17 mg/dL (ref 6–23)
CALCIUM: 9.4 mg/dL (ref 8.4–10.5)
CO2: 26 mEq/L (ref 19–32)
CREATININE: 0.9 mg/dL (ref 0.40–1.20)
Chloride: 106 mEq/L (ref 96–112)
GFR: 71.34 mL/min (ref >60.00)
GLUCOSE: 88 mg/dL (ref 70–99)
Potassium: 4.5 mEq/L (ref 3.5–5.1)
Sodium: 139 mEq/L (ref 135–145)

## 2016-09-20 LAB — TSH: TSH: 0.09 u[IU]/mL — ABNORMAL LOW (ref 0.35–4.50)

## 2016-09-20 MED ORDER — PAROXETINE HCL 10 MG PO TABS: 10.0000 mg | ORAL_TABLET | Freq: Every day | ORAL | 3 refills | Status: DC

## 2016-09-20 MED ORDER — LEVOTHYROXINE SODIUM 125 MCG PO TABS: 125.0000 ug | ORAL_TABLET | Freq: Every day | ORAL | 0 refills | Status: DC

## 2016-09-20 NOTE — Addendum Note (Signed)
Addended by: Olean ReeGESSNER, Ginnie Marich B on: 09/20/2016 03:21 PM   Modules accepted: Orders

## 2016-09-20 NOTE — Patient Instructions (Signed)
Good to see you today!  

## 2016-09-20 NOTE — Progress Notes (Signed)
   Subjective:    Patient ID: Kimberly Mooney, female    DOB: [birth year], [age] y.o.   MRN: [id number]  HPI This is a 46 yo female who presents today for follow up of hypothyroidism. Her son is a Chief Strategy Officerrising senior in [employer], trying to decide on college, looking at schools far away.  Dad passed away a couple of months ago from colon cancer. Was diagnosed 1.5 years ago and did not have treatment. She feels her mood is to be expected.  Was seen last month with rib pain which has resolved. Labs at that time showed mildly decreased creatine clearance with normal BUN/Creatinine. Will recheck today.   Past Medical History:  Diagnosis Date  . Anxiety   . Depression   . GERD (gastroesophageal reflux disease)   . Thyroid disease    hypothyroidism   Past Surgical History:  Procedure Laterality Date  . CHOLECYSTECTOMY     Family History  Problem Relation Age of Onset  . Cancer Mother        breast  . Hypertension Mother   . Osteoporosis Mother   . Hypertension Father   . Cancer Father    Social History  Substance Use Topics  . Smoking status: Never Smoker  . Smokeless tobacco: Never Used     Comment: smoked occasionally in college  . Alcohol use 0.0 oz/week     Comment: social      Review of Systems No fatigue, no diarrhea, no change in constipation (takes Miralax), no palpitations    Objective:   Physical Exam Physical Exam  Constitutional: Oriented to person, place, and time. She appears well-developed and well-nourished.  HENT:  Head: Normocephalic and atraumatic.  Eyes: Conjunctivae are normal.  Neck: Normal range of motion. Neck supple.  Cardiovascular: Normal rate, regular rhythm and normal heart sounds.   Pulmonary/Chest: Effort normal and breath sounds normal.  Musculoskeletal: Normal range of motion.  Neurological: Alert and oriented to person, place, and time.  Skin: Skin is warm and dry.  Psychiatric: Normal mood and affect. Behavior is normal. Judgment and thought  content normal.  Vitals reviewed. BP 116/78   Pulse 86   Ht 5' 6.5" (1.689 m)   Wt 150 lb (68 kg)   LMP 08/23/2016   SpO2 98%   BMI 23.85 kg/m    Wt Readings from Last 3 Encounters:  09/20/16 150 lb (68 kg)  08/29/16 150 lb 4 oz (68.2 kg)  04/03/16 149 lb 6.4 oz (67.8 kg)       Assessment & Plan:  1. Hypothyroidism due to Hashimoto's thyroiditis - due for TSH today  2. Anxiety - PARoxetine (PAXIL) 10 MG tablet; Take 1 tablet (10 mg total) by mouth daily.  Dispense: 90 tablet; Refill: 3 - encouraged continued regular exercise, good sleep hygiene.  3. Decreased creatinine clearance - Basic metabolic panel  4. Hashimoto's thyroiditis - TSH  - follow up in 6 months  Kimberly Reeeborah Gessner, FNP-BC   [facility] [place], [facility]  09/20/2016 9:44 AM

## 2016-09-30 ENCOUNTER — Ambulatory Visit: Payer: Managed Care, Other (non HMO) | Admitting: Family Medicine

## 2016-11-28 ENCOUNTER — Other Ambulatory Visit (INDEPENDENT_AMBULATORY_CARE_PROVIDER_SITE_OTHER): Payer: 59

## 2016-11-28 ENCOUNTER — Other Ambulatory Visit: Payer: Self-pay | Admitting: Family Medicine

## 2016-11-28 ENCOUNTER — Encounter: Payer: Self-pay | Admitting: Family Medicine

## 2016-11-28 DIAGNOSIS — E063 Autoimmune thyroiditis: Secondary | ICD-10-CM | POA: Diagnosis not present

## 2016-11-28 DIAGNOSIS — E038 Other specified hypothyroidism: Secondary | ICD-10-CM | POA: Diagnosis not present

## 2016-11-28 LAB — TSH: TSH: 1.41 u[IU]/mL (ref 0.35–4.50)

## 2016-11-29 ENCOUNTER — Other Ambulatory Visit: Payer: Self-pay | Admitting: Family Medicine

## 2016-11-29 MED ORDER — LEVOTHYROXINE SODIUM 125 MCG PO TABS: 125.0000 ug | ORAL_TABLET | Freq: Every day | ORAL | 1 refills | Status: DC

## 2016-12-30 ENCOUNTER — Other Ambulatory Visit: Payer: Self-pay | Admitting: Nurse Practitioner

## 2016-12-30 DIAGNOSIS — Z1231 Encounter for screening mammogram for malignant neoplasm of breast: Secondary | ICD-10-CM

## 2017-01-14 ENCOUNTER — Ambulatory Visit
Admission: RE | Admit: 2017-01-14 | Discharge: 2017-01-14 | Disposition: A | Discharge status: Home / Self Care | Payer: 59 | Source: Ambulatory Visit | Attending: Nurse Practitioner | Admitting: Nurse Practitioner

## 2017-01-14 DIAGNOSIS — Z1231 Encounter for screening mammogram for malignant neoplasm of breast: Secondary | ICD-10-CM

## 2017-01-23 ENCOUNTER — Other Ambulatory Visit: Payer: Self-pay | Admitting: Nurse Practitioner

## 2017-01-23 ENCOUNTER — Other Ambulatory Visit (HOSPITAL_COMMUNITY)
Admission: RE | Admit: 2017-01-23 | Discharge: 2017-01-23 | Disposition: A | Discharge status: Home / Self Care | Payer: 59 | Source: Ambulatory Visit | Attending: Nurse Practitioner | Admitting: Nurse Practitioner

## 2017-01-23 DIAGNOSIS — Z01419 Encounter for gynecological examination (general) (routine) without abnormal findings: Secondary | ICD-10-CM | POA: Diagnosis not present

## 2017-01-27 LAB — CYTOLOGY - PAP
Diagnosis: UNDETERMINED — AB
HPV (WINDOPATH): NOT DETECTED

## 2017-02-13 ENCOUNTER — Ambulatory Visit: Payer: 59 | Admitting: Primary Care

## 2017-02-13 ENCOUNTER — Encounter: Payer: Self-pay | Admitting: Primary Care

## 2017-02-13 VITALS — BP 106/72 | HR 74 | Temp 98.4°F | Ht 66.5 in | Wt 149.4 lb

## 2017-02-13 DIAGNOSIS — R1013 Epigastric pain: Secondary | ICD-10-CM | POA: Diagnosis not present

## 2017-02-13 DIAGNOSIS — K219 Gastro-esophageal reflux disease without esophagitis: Secondary | ICD-10-CM | POA: Diagnosis not present

## 2017-02-13 MED ORDER — ESOMEPRAZOLE MAGNESIUM 40 MG PO CPDR: 40.0000 mg | DELAYED_RELEASE_CAPSULE | Freq: Every day | ORAL | 1 refills | Status: DC

## 2017-02-13 NOTE — Patient Instructions (Signed)
We've increased the dose of your Nexium from 20 mg to 40 mg for one month. I sent a new prescription to your pharmacy.  Stop by the lab prior to leaving today. I will notify you of your results once received.   Please notify me if no improvement in symptoms in one week.  It was a pleasure meeting you!   Food Choices for Gastroesophageal Reflux Disease, Adult When you have gastroesophageal reflux disease (GERD), the foods you eat and your eating habits are very important. Choosing the right foods can help ease your discomfort. What guidelines do I need to follow?  Choose fruits, vegetables, whole grains, and low-fat dairy products.  Choose low-fat meat, fish, and poultry.  Limit fats such as oils, salad dressings, butter, nuts, and avocado.  Keep a food diary. This helps you identify foods that cause symptoms.  Avoid foods that cause symptoms. These may be different for everyone.  Eat small meals often instead of 3 large meals a day.  Eat your meals slowly, in a place where you are relaxed.  Limit fried foods.  Cook foods using methods other than frying.  Avoid drinking alcohol.  Avoid drinking large amounts of liquids with your meals.  Avoid bending over or lying down until 2-3 hours after eating. What foods are not recommended? These are some foods and drinks that may make your symptoms worse: Vegetables Tomatoes. Tomato juice. Tomato and spaghetti sauce. Chili peppers. Onion and garlic. Horseradish. Fruits Oranges, grapefruit, and lemon (fruit and juice). Meats High-fat meats, fish, and poultry. This includes hot dogs, ribs, ham, sausage, salami, and bacon. Dairy Whole milk and chocolate milk. Sour cream. Cream. Butter. Ice cream. Cream cheese. Drinks Coffee and tea. Bubbly (carbonated) drinks or energy drinks. Condiments Hot sauce. Barbecue sauce. Sweets/Desserts Chocolate and cocoa. Donuts. Peppermint and spearmint. Fats and Oils High-fat foods. This includes  [place] fries and potato chips. Other Vinegar. Strong spices. This includes black pepper, white pepper, red pepper, cayenne, curry powder, cloves, ginger, and chili powder. The items listed above may not be a complete list of foods and drinks to avoid. Contact your dietitian for more information. This information is not intended to replace advice given to you by your health care provider. Make sure you discuss any questions you have with your health care provider. Document Released: 07/30/2011 Document Revised: 07/06/2015 Document Reviewed: 12/02/2012 Elsevier Interactive Patient Education  2017 [employer]

## 2017-02-13 NOTE — Addendum Note (Signed)
Addended by: Alvina ChouWALSH, Lottie Sigman J on: 02/13/2017 02:30 PM   Modules accepted: Orders

## 2017-02-13 NOTE — Progress Notes (Signed)
Subjective:    Patient ID: Kimberly Mooney, female    DOB: 1970-04-15, 47 y.o.   MRN: [id number]  HPI  [name] is a 47 year old female with a history of anxiety, hypothyroidism, GERD, urinary incontinence, cholecystectomy who presents today with a chief complaint of abdominal discomfort.   She also reports belching and intermittent nausea. Her discomfort is located to the epigastric region that began in early November 2018 while in [place], lasted a few days, then dissipated. Her symptoms returned two weeks ago without nausea. She's managed on Nexium 20 mg once daily, recently switched from Prevacid 10 days ago. Symptoms aren't affected by eating and drinking. She denies recent constipation, diarrhea, bloody stools, fevers.  Review of Systems  Constitutional: Positive for fever.  Gastrointestinal: Positive for abdominal pain and nausea. Negative for blood in stool, constipation and diarrhea.       Belching        Past Medical History:  Diagnosis Date  . Anxiety   . Depression   . GERD (gastroesophageal reflux disease)   . Thyroid disease    hypothyroidism     Social History   Socioeconomic History  . Marital status: Single    Spouse name: Not on file  . Number of children: Not on file  . Years of education: Not on file  . Highest education level: Not on file  Social Needs  . Financial resource strain: Not on file  . Food insecurity - worry: Not on file  . Food insecurity - inability: Not on file  . Transportation needs - medical: Not on file  . Transportation needs - non-medical: Not on file  Occupational History  . Not on file  Tobacco Use  . Smoking status: Never Smoker  . Smokeless tobacco: Never Used  . Tobacco comment: smoked occasionally in college  Substance and Sexual Activity  . Alcohol use: Yes    Alcohol/week: 0.0 oz    Comment: social  . Drug use: No  . Sexual activity: Yes    Birth control/protection: Other-see comments  Other Topics  Concern  . 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 CHOLECYSTECTOMY      Family History  Problem Relation Age of Onset  . Cancer Mother        breast  . Hypertension Mother   . Osteoporosis Mother   . Hypertension Father   . Cancer Father     Allergies  Allergen Reactions  . Shellfish Allergy Other (See Comments)    REACTION: "severe abdominal pain/diarreha"  . Amoxicillin Diarrhea  . Augmentin [Amoxicillin-Pot Clavulanate] Diarrhea  . Codeine Nausea And Vomiting  . Doxycycline Rash  . Penicillins Diarrhea and Nausea And Vomiting  . Shellfish-Derived Products Diarrhea    Current Outpatient Medications on File Prior to Visit  Medication Sig Dispense Refill  . acetaminophen (TYLENOL) 160 mg/5 mL SOLN Take by mouth.    . ALPRAZolam (XANAX) 0.25 MG tablet Take 1 tablet (0.25 mg total) by mouth 2 (two) times daily as needed (Flying anxiety). 30 tablet 0  . diphenhydrAMINE (BENADRYL) 25 MG tablet Take by mouth.    . esomeprazole (NEXIUM) 20 MG capsule Take 20 mg by mouth daily at 12 noon.    Marland Kitchen. ibuprofen (ADVIL,MOTRIN) 200 MG tablet Take 400 mg by mouth as needed.    .Marland Kitchen  levothyroxine (SYNTHROID, LEVOTHROID) 125 MCG tablet Take 1 tablet (125 mcg total) by mouth daily. 90 tablet 1  . PARoxetine (PAXIL) 10 MG tablet Take 1 tablet (10 mg total) by mouth daily. 90 tablet 3  . polyethylene glycol (MIRALAX / GLYCOLAX) packet Take 17 g by mouth daily.     No current facility-administered medications on file prior to visit.     BP 106/72   Pulse 74   Temp 98.4 F (36.9 C) (Oral)   Ht 5' 6.5" (1.689 m)   Wt 149 lb 6.4 oz (67.8 kg)   SpO2 99%   BMI 23.75 kg/m    Objective:   Physical Exam  Constitutional: She appears well-nourished.  Neck: Neck supple.  Cardiovascular: Normal rate and regular rhythm.  Pulmonary/Chest: Effort normal and breath sounds  normal.  Abdominal: Soft. Bowel sounds are normal. There is no tenderness.  Skin: Skin is warm and dry.          Assessment & Plan:  Abdominal Pain:  Located to epigastric region, history of cholecystectomy. Symptoms appear to be related to GERD, she is on Nexium for which she started 10 days ago.  Exam unremarkable. Will check H pylori stool specimen to rule out given recent overseas travel. Increase Nexium to 40 mg once daily x 1 month, then reduce down to 20 mg. She will update in 1-2 weeks if no improvement.  Morrie Sheldon, NP

## 2017-02-24 ENCOUNTER — Other Ambulatory Visit (INDEPENDENT_AMBULATORY_CARE_PROVIDER_SITE_OTHER): Payer: 59

## 2017-02-24 DIAGNOSIS — K219 Gastro-esophageal reflux disease without esophagitis: Secondary | ICD-10-CM | POA: Diagnosis not present

## 2017-02-24 DIAGNOSIS — R1013 Epigastric pain: Secondary | ICD-10-CM

## 2017-02-25 LAB — HELICOBACTER PYLORI  SPECIAL ANTIGEN
MICRO NUMBER:: 90054840
SPECIMEN QUALITY: ADEQUATE

## 2017-06-13 ENCOUNTER — Ambulatory Visit: Payer: 59 | Admitting: Family Medicine

## 2017-06-13 ENCOUNTER — Encounter: Payer: Self-pay | Admitting: Family Medicine

## 2017-06-13 VITALS — BP 122/80 | HR 85 | Temp 98.8°F | Ht 66.5 in | Wt 151.0 lb

## 2017-06-13 DIAGNOSIS — R1031 Right lower quadrant pain: Secondary | ICD-10-CM

## 2017-06-13 DIAGNOSIS — F419 Anxiety disorder, unspecified: Secondary | ICD-10-CM | POA: Diagnosis not present

## 2017-06-13 DIAGNOSIS — E063 Autoimmune thyroiditis: Secondary | ICD-10-CM

## 2017-06-13 DIAGNOSIS — E038 Other specified hypothyroidism: Secondary | ICD-10-CM

## 2017-06-13 LAB — COMPREHENSIVE METABOLIC PANEL
ALBUMIN: 4.3 g/dL (ref 3.5–5.2)
ALK PHOS: 38 U/L — AB (ref 39–117)
ALT: 18 U/L (ref 0–35)
AST: 17 U/L (ref 0–37)
BUN: 14 mg/dL (ref 6–23)
CO2: 28 mEq/L (ref 19–32)
CREATININE: 0.87 mg/dL (ref 0.40–1.20)
Calcium: 9.2 mg/dL (ref 8.4–10.5)
Chloride: 103 mEq/L (ref 96–112)
GFR: 73.96 mL/min (ref >60.00)
Glucose, Bld: 84 mg/dL (ref 70–99)
Potassium: 4 mEq/L (ref 3.5–5.1)
SODIUM: 139 meq/L (ref 135–145)
TOTAL PROTEIN: 7 g/dL (ref 6.0–8.3)
Total Bilirubin: 0.2 mg/dL (ref 0.2–1.2)

## 2017-06-13 LAB — CBC WITH DIFFERENTIAL/PLATELET
BASOS ABS: 0 10*3/uL (ref 0.0–0.1)
Basophils Relative: 0.6 % (ref 0.0–3.0)
Eosinophils Absolute: 0.1 10*3/uL (ref 0.0–0.7)
Eosinophils Relative: 1.6 % (ref 0.0–5.0)
HCT: 40 % (ref 36.0–46.0)
HEMOGLOBIN: 13.7 g/dL (ref 12.0–15.0)
LYMPHS ABS: 2.7 10*3/uL (ref 0.7–4.0)
Lymphocytes Relative: 35 % (ref 12.0–46.0)
MCHC: 34.3 g/dL (ref 30.0–36.0)
MCV: 92.2 fl (ref 78.0–100.0)
MONOS PCT: 5.1 % (ref 3.0–12.0)
Monocytes Absolute: 0.4 10*3/uL (ref 0.1–1.0)
NEUTROS PCT: 57.7 % (ref 43.0–77.0)
Neutro Abs: 4.4 10*3/uL (ref 1.4–7.7)
PLATELETS: 199 10*3/uL (ref 150.0–400.0)
RBC: 4.34 Mil/uL (ref 3.87–5.11)
RDW: 13.3 % (ref 11.5–15.5)
WBC: 7.6 10*3/uL (ref 4.0–10.5)

## 2017-06-13 LAB — TSH: TSH: 3.41 u[IU]/mL (ref 0.35–4.50)

## 2017-06-13 MED ORDER — PAROXETINE HCL ER 12.5 MG PO TB24: 12.5000 mg | ORAL_TABLET | Freq: Every day | ORAL | 3 refills | Status: DC

## 2017-06-13 MED ORDER — ALPRAZOLAM 0.25 MG PO TABS: 0.2500 mg | ORAL_TABLET | Freq: Two times a day (BID) | ORAL | 0 refills | Status: DC | PRN

## 2017-06-13 NOTE — Patient Instructions (Signed)
Good to see you  I will notify you of labs and [place] results

## 2017-06-13 NOTE — Progress Notes (Signed)
Subjective:    Patient ID: Kimberly Mooney, female    DOB: [birth year], 47 y.o.   MRN: [id number]  HPI This is a 47 yo female who presents today for follow up of right sided abdominal pain. Started 5 months ago. Since symptoms started, she was seen in urgent care and had routine screening colonoscopy which was negative except for internal hemorrhoids. Pain worse with sitting, better with lying down. Not worsened by exercise. A little improvement in pain with Tylenol arthritis and Sudafed. Has chronic, intermittent loose alternating with hard bowel movements. No change. No blood or mucus in BM.   She will be attending [employer] university in the fall for the FNP program. She will be moving to [facility] to live with her boyfriend and go to school. She is concerned about her anxiety with increased stress and requests change of her paroxetine from 10 mg to paroxetine CR 12.5 mg.     Past Medical History:  Diagnosis Date  . Anxiety   . Depression   . GERD (gastroesophageal reflux disease)   . Thyroid disease    hypothyroidism   Past Surgical History:  Procedure Laterality Date  . CHOLECYSTECTOMY     Family History  Problem Relation Age of Onset  . Cancer Mother        breast  . Hypertension Mother   . Osteoporosis Mother   . Hypertension Father   . Cancer Father    Social History   Tobacco Use  . Smoking status: Never Smoker  . Smokeless tobacco: Never Used  . Tobacco comment: smoked occasionally in college  Substance Use Topics  . Alcohol use: Yes    Alcohol/week: 0.0 oz    Comment: social  . Drug use: No      Review of Systems Per HPI    Objective:   Physical Exam  Constitutional: She is oriented to person, place, and time. She appears well-developed and well-nourished. No distress.  HENT:  Head: Normocephalic and atraumatic.  Eyes: Conjunctivae are normal.  Cardiovascular: Normal rate and regular rhythm.  Pulmonary/Chest: Effort normal and breath sounds  normal.  Abdominal: Soft. Bowel sounds are normal. She exhibits no distension and no mass. There is no tenderness. There is no rebound and no guarding. No hernia (examined with patient supine and standing).  Neurological: She is alert and oriented to person, place, and time.  Skin: Skin is warm and dry. She is not diaphoretic.  Psychiatric: She has a normal mood and affect. Her behavior is normal. Judgment and thought content normal.  Vitals reviewed.     BP 122/80 (BP Location: Right Arm, Patient Position: Sitting, Cuff Size: Normal)   Pulse 85   Temp 98.8 F (37.1 C) (Oral)   Ht 5' 6.5" (1.689 m)   Wt 151 lb (68.5 kg)   LMP 05/29/2017   SpO2 99%   BMI 24.01 kg/m  Wt Readings from Last 3 Encounters:  06/13/17 151 lb (68.5 kg)  02/13/17 149 lb 6.4 oz (67.8 kg)  09/20/16 150 lb (68 kg)       Assessment & Plan:  Discussed with Dr. Para March 1. Anxiety - encouraged regular self care with increased stressors - ALPRAZolam (XANAX) 0.25 MG tablet; Take 1 tablet (0.25 mg total) by mouth 2 (two) times daily as needed (Flying anxiety).  Dispense: 30 tablet; Refill: 0 - PARoxetine (PAXIL-CR) 12.5 MG 24 hr tablet; Take 1 tablet (12.5 mg total) by mouth daily.  Dispense: 90 tablet; Refill: 3  2. Right  lower quadrant pain - unclear etiology, will check labs and [place] - Comprehensive metabolic panel - CBC with Differential - US PELVIC COMPLETE WITH TRANSVAGINAL; Future  3. Hypothyroidism due to Hashimoto's thyroiditis - TSH  Olean Ree, FNP-BC  [facility], [facility]  06/14/2017 12:56 AM

## 2017-06-14 ENCOUNTER — Encounter: Payer: Self-pay | Admitting: Family Medicine

## 2017-06-17 ENCOUNTER — Ambulatory Visit
Admission: RE | Admit: 2017-06-17 | Discharge: 2017-06-17 | Disposition: A | Discharge status: Home / Self Care | Payer: 59 | Source: Ambulatory Visit | Attending: Family Medicine | Admitting: Family Medicine

## 2017-06-17 DIAGNOSIS — R1031 Right lower quadrant pain: Secondary | ICD-10-CM

## 2017-06-19 ENCOUNTER — Other Ambulatory Visit: Payer: Self-pay | Admitting: Family Medicine

## 2017-06-19 ENCOUNTER — Encounter: Payer: Self-pay | Admitting: Family Medicine

## 2017-06-19 DIAGNOSIS — F419 Anxiety disorder, unspecified: Secondary | ICD-10-CM

## 2017-06-19 MED ORDER — ALPRAZOLAM 0.25 MG PO TABS: 0.2500 mg | ORAL_TABLET | Freq: Two times a day (BID) | ORAL | 0 refills | Status: AC | PRN

## 2017-06-21 ENCOUNTER — Other Ambulatory Visit: Payer: Self-pay | Admitting: Family Medicine

## 2017-06-24 ENCOUNTER — Encounter: Payer: Self-pay | Admitting: Family Medicine

## 2017-07-14 ENCOUNTER — Other Ambulatory Visit: Payer: Self-pay | Admitting: Family Medicine

## 2017-07-14 DIAGNOSIS — F419 Anxiety disorder, unspecified: Secondary | ICD-10-CM

## 2017-07-14 NOTE — Telephone Encounter (Signed)
Levothyroxine  Last filled 06/25/17 # 30 refills 0 Xanax 06/19/17 #30 refills 0   Last OV 06/13/17

## 2017-08-05 ENCOUNTER — Telehealth: Payer: Self-pay | Admitting: Family Medicine

## 2017-08-05 NOTE — Telephone Encounter (Signed)
Copied from CRM [phone]#[id number]. Topic: Quick Communication - See Telephone Encounter >> Aug 05, 2017 12:14 PM Terisa Starraylor, Brittany L wrote: CRM for notification. See Telephone encounter for: 08/05/17.  Patient is requesting these be done for her grad school. Varicella Titer, Tspot Blood Test, Hep B immunizations    Call back @ [phone]

## 2017-08-06 ENCOUNTER — Other Ambulatory Visit: Payer: Self-pay | Admitting: Family Medicine

## 2017-08-06 DIAGNOSIS — Z111 Encounter for screening for respiratory tuberculosis: Secondary | ICD-10-CM

## 2017-08-06 DIAGNOSIS — Z23 Encounter for immunization: Secondary | ICD-10-CM

## 2017-08-06 DIAGNOSIS — Z789 Other specified health status: Secondary | ICD-10-CM

## 2017-08-06 NOTE — Telephone Encounter (Signed)
Called and spoke to patient understanding verbalized and patient will schedule both lab and nurse visit. Nothing further needed at this time.

## 2017-08-06 NOTE — Telephone Encounter (Signed)
Please let her know that I have placed orders. Schedule nurse and lab visit.

## 2017-08-06 NOTE — Telephone Encounter (Signed)
Please call patient and clarify what she needs- varicella titer? What is T spot? Does she need hep B titers? 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

## 2017-08-06 NOTE — Telephone Encounter (Signed)
Called and spoke with patient she states that she needs TB blood test, Varicella titer and to start the series if Hep B.

## 2017-08-12 ENCOUNTER — Ambulatory Visit (INDEPENDENT_AMBULATORY_CARE_PROVIDER_SITE_OTHER): Payer: 59

## 2017-08-12 ENCOUNTER — Other Ambulatory Visit (INDEPENDENT_AMBULATORY_CARE_PROVIDER_SITE_OTHER): Payer: 59

## 2017-08-12 DIAGNOSIS — Z111 Encounter for screening for respiratory tuberculosis: Secondary | ICD-10-CM | POA: Diagnosis not present

## 2017-08-12 DIAGNOSIS — Z23 Encounter for immunization: Secondary | ICD-10-CM

## 2017-08-12 DIAGNOSIS — Z789 Other specified health status: Secondary | ICD-10-CM

## 2017-08-12 NOTE — Progress Notes (Signed)
Per orders of Deboraha Sprangebbie Gessner, NP, injection of Hepatitis B given by Roena Maladyevontenno, Leobardo Granlund Y. Patient tolerated injection well.

## 2017-08-15 LAB — QUANTIFERON-TB GOLD PLUS
Mitogen-NIL: >10 IU/mL
NIL: 0.03 IU/mL
QuantiFERON-TB Gold Plus: NEGATIVE
TB1-NIL: <0 IU/mL
TB2-NIL: 0 IU/mL

## 2017-08-15 LAB — VARICELLA ZOSTER ANTIBODY, IGG: Varicella IgG: 1317 index

## 2017-08-19 NOTE — Telephone Encounter (Signed)
Forms faxed to number provided, patient request a copy be placed for pick up as well. Informed her that copy would be up front for pick up. Nothing further needed at this time.    Copied from CRM [phone]. Topic: General - Other >> Aug 15, 2017  8:22 AM [name], Kimberly Mooney wrote: Reason for CRM:pt would like physical and immunization form to be fax to her [phone]. Pt unable to come pick up forms

## 2017-11-05 ENCOUNTER — Encounter: Payer: Self-pay | Admitting: Family Medicine

## 2017-11-05 ENCOUNTER — Other Ambulatory Visit: Payer: Self-pay | Admitting: Family Medicine

## 2017-11-05 MED ORDER — PAROXETINE HCL 10 MG PO TABS: 10.0000 mg | ORAL_TABLET | Freq: Every day | ORAL | 3 refills | Status: DC

## 2018-01-13 ENCOUNTER — Encounter: Payer: Self-pay | Admitting: Family Medicine

## 2018-01-13 MED ORDER — LEVOTHYROXINE SODIUM 125 MCG PO TABS: 125.0000 ug | ORAL_TABLET | Freq: Every day | ORAL | 1 refills | Status: DC

## 2018-01-13 NOTE — Telephone Encounter (Signed)
Levothyroxine refilled.  Will forward to Debbie to address increasing Paroxetine.

## 2018-01-14 ENCOUNTER — Other Ambulatory Visit: Payer: Self-pay | Admitting: Family Medicine

## 2018-01-14 DIAGNOSIS — F419 Anxiety disorder, unspecified: Secondary | ICD-10-CM

## 2018-01-14 DIAGNOSIS — E063 Autoimmune thyroiditis: Secondary | ICD-10-CM

## 2018-01-14 MED ORDER — PAROXETINE HCL 20 MG PO TABS: 20.0000 mg | ORAL_TABLET | Freq: Every day | ORAL | 1 refills | Status: AC

## 2018-01-23 ENCOUNTER — Other Ambulatory Visit: Payer: Self-pay | Admitting: Family Medicine

## 2018-02-16 ENCOUNTER — Encounter: Payer: Self-pay | Admitting: Family Medicine

## 2018-05-20 ENCOUNTER — Ambulatory Visit (INDEPENDENT_AMBULATORY_CARE_PROVIDER_SITE_OTHER): Payer: BLUE CROSS/BLUE SHIELD | Admitting: Family Medicine

## 2018-05-20 ENCOUNTER — Ambulatory Visit: Payer: Self-pay | Admitting: Family Medicine

## 2018-05-20 ENCOUNTER — Encounter: Payer: Self-pay | Admitting: Family Medicine

## 2018-05-20 VITALS — BP 143/99

## 2018-05-20 DIAGNOSIS — R072 Precordial pain: Secondary | ICD-10-CM | POA: Diagnosis not present

## 2018-05-20 DIAGNOSIS — R0602 Shortness of breath: Secondary | ICD-10-CM | POA: Diagnosis not present

## 2018-05-20 NOTE — Telephone Encounter (Signed)
Pt called into the [facility] and was transferred to me for triage. She is c/o shortness of breath with activity like taking out the trash, going up stairs that is unusual for her.  She is also c/o a lot of fatigue.   She normally swims laps so this is unusual for her to feel this fatigued.  Her other symptom were a sharp pain under her left breast that lasts about 2-3 seconds.   Denies radiation of pain/discomfort any where else. I used the chest pain triage for more specifics related to this chest pain.  I let her know that with chest pain and shortness of breath we usually refer people to the emergency room.   She was seen at [facility] and told it was probably allergies.   She has not improved with taking the Zyzal they suggested.  Labs were normal.   No x-rays taken. 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   Not been in clinic rotation for 2 weeks now.   No known COVID-19 exposure she is aware of.  I told the pt she may need to go to the ED.  She said she wasn't going to the ED because she didn't really feel this was cardiac related.  "But if you say I need to go that's fine but I'm letting you know I'm not going".    I let her know I understand.   Let's see if they will see you via a virtual visit because of the COVID-19.  I contacted Rena at the [facility] practice let her know what was going on with the pt.   That I felt it would be ok for her to be seen rather than go to the ED.     Protocol with the shortness of breath was to see PCP within 3 days however with the chest pain protocol it's to be seen in the ED.     I warm transferred the pt to [facility].     Reason for Disposition . [1] Chest pain(s) lasting a few seconds AND [2] persists > 3 days  Answer Assessment - Initial Assessment Questions 1. RESPIRATORY STATUS: "Describe your breathing?" (e.g., wheezing, shortness of breath, unable to speak,  severe coughing)      Shortness of breath for a week.  I was seen at [facility] on Sunday.   My O2 sat was good.   My BP 141/90.    They thought it may be allergies.  Zyral started for allergies.   No relief.    Walking up stairs and doing dishes makes me short of breath.   2. ONSET: "When did this breathing problem begin?"      A week ago.  My labs were normal. 3. PATTERN "Does the difficult breathing come and go, or has it been constant since it started?"      I can be laying down and feel short of breath.   I'm very tired. 4. SEVERITY: "How bad is your breathing?" (e.g., mild, moderate, severe)    - MILD: No SOB at rest, mild SOB with walking, speaks normally in sentences, can lay down, no retractions, pulse < 100.    - MODERATE: SOB at rest, SOB with minimal exertion and prefers to sit, cannot lie down flat, speaks in phrases, mild retractions, audible wheezing, pulse 100-120.    - SEVERE: Very SOB at rest, speaks in single  words, struggling to breathe, sitting hunched forward, retractions, pulse > 120      I'm doing Zoom classes.   I am very fatigued.   I use to exercise swimming. 5. RECURRENT SYMPTOM: "Have you had difficulty breathing before?" If so, ask: "When was the last time?" and "What happened that time?"      No 6. CARDIAC HISTORY: "Do you have any history of heart disease?" (e.g., heart attack, angina, bypass surgery, angioplasty)      No 7. LUNG HISTORY: "Do you have any history of lung disease?"  (e.g., pulmonary embolus, asthma, emphysema)     No 8. CAUSE: "What do you think is causing the breathing problem?"      I don't know.  I hope it's allergies.   I'm scared of the COVID-19 too. 9. OTHER SYMPTOMS: "Do you have any other symptoms? (e.g., dizziness, runny nose, cough, chest pain, fever)     I'm having pain under my left breast.  It comes and goes.   Sometimes it's a very sharp stabbing pain for 2-3 seconds.   Everything started about a week ago.   I was coughing but it's gotten  better.  No symptoms with the pain under my left breast.   10. PREGNANCY: "Is there any chance you are pregnant?" "When was your last menstrual period?"       No   still periods 11. TRAVEL: "Have you traveled out of the country in the last month?" (e.g., travel history, exposures)       I was in [place] in middle of [place].   No known exposures. 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  Answer Assessment - Initial Assessment Questions 1. LOCATION: "Where does it hurt?"       Under my left breast for about a week with the other symptoms. 2. RADIATION: "Does the pain go anywhere else?" (e.g., into neck, jaw, arms, back)     No radiation. 3. ONSET: "When did the chest pain begin?" (Minutes, hours or days)      A week ago with my other symptoms. 4. PATTERN "Does the pain come and go, or has it been constant since it started?"  "Does it get worse with exertion?"      It occurs daily.   It's anoying more than a pain except the sharp pains are infrequent.   That's only happened a couple of times in the past week. 5. DURATION: "How long does it last" (e.g., seconds, minutes, hours)     2-3 seconds 6. SEVERITY: "How bad is the pain?"  (e.g., Scale 1-10; mild, moderate, or severe)    - MILD (1-3): doesn't interfere with normal activities     - MODERATE (4-7): interferes with normal activities or awakens from sleep    - SEVERE (8-10): excruciating pain, unable to do any normal activities       7-8 on pain scale when sharp pains occur. 7. CARDIAC RISK FACTORS: "Do you have any history of heart problems or risk factors for heart disease?" (e.g., prior heart attack, angina; high blood pressure, diabetes, being overweight, high cholesterol, smoking, or strong family history of heart disease)     My BP has been high over the past week.   I have hypothyroid.   I have anxiety.     8. PULMONARY RISK FACTORS: "Do you have any history of lung disease?"  (e.g., blood clots in  lung, asthma, emphysema, birth control pills)     *  No Answer* 9. CAUSE: "What do you think is causing the chest pain?"     See above triage notes. 10. OTHER SYMPTOMS: "Do you have any other symptoms?" (e.g., dizziness, nausea, vomiting, sweating, fever, difficulty breathing, cough)       I had mild nausea when the symptoms started but not now.  Appetite is fine. 11. PREGNANCY: "Is there any chance you are pregnant?" "When was your last menstrual period?"       No  Protocols used: CHEST PAIN-A-AH, BREATHING DIFFICULTY-A-AH

## 2018-05-20 NOTE — Telephone Encounter (Signed)
Pt having dull achy lt rib cage pain that does not radiate anywhere.  In the past wk has had couple of episodes of a sharp pain in lt chest that only last for seconds. No prod cough or wheeze. No fever 98.6 over weekend; cannot take temp now but does not feel warm. No travel and no exposure to + covid or flu. Pt is at home in [place] now. Pt seen 05/17/18 at UC at [facility]. Pt is in no distress now. Never had CP or SOB washing dishes before. Pt will have virtual visit with Harlin Heys FNP 05/20/18 at 4 PM.

## 2018-05-20 NOTE — Telephone Encounter (Signed)
Virtual visit completed. See OV note for further details.

## 2018-05-20 NOTE — Progress Notes (Signed)
Virtual Visit via Video Note  I connected with Kimberly Mooney on 05/20/18 at  4:00 PM EDT by a video enabled telemedicine application and verified that I am speaking with the correct person using two identifiers.   I discussed the limitations of evaluation and management by telemedicine and the availability of in person appointments. The patient expressed understanding and agreed to proceed.  History of Present Illness: This is a 48 year old female who requested virtual visit for shortness of breath x1 week.  The patient is an Financial trader.  Her symptoms started about a week ago with severe fatigue and shortness of breath with activity.  She was seen 3 days ago at [facility] urgent care.  I am unable to see any records from this visit.  The patient tells me that her vital signs included a normal pulse ox of 99% and a blood pressure of 140/90.  The patient had a dry cough several days ago this is decreased significantly.  She has not had any fever during the time of symptoms.  She does have pain under her left breast, it is constant, with occasional 2 to 3 seconds of sharpness.  The sharp pain has occurred once or twice.  She reports that the pain feels "deep." She took her blood pressure at home recently and it was 143/99.  She reports feeling short of breath with minimal activity including standing doing dishes, mowing her trash can down her driveway and going up 1 flight of stairs in her home.  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  She was told to try size all in the urgent care thinking her symptoms might be related to allergies.  She has taken 2 doses over the last 2 evenings without improvement.  She was getting some improvement of left anterior chest pain with Naprosyn but has discontinued this when her blood pressure was running high.  She has been taking acetaminophen with no relief of pain, but it seems to help her fatigue. She denies wheeze, calf pain or  swelling.  Appetite is normal.  She endorses good fluid intake.  She was on Yaz oral contraceptives for 2 months and she stopped this approximately 10 days ago due to prolonged menses.  Last travel was about a month ago, she had a car trip.  She has been sleeping well and has not had any shortness of breath lying down.   Observations/Objective: The patient was alert and answer questions appropriately.  She was not in any distress.  She was able to converse without appearing short of breath.  Assessment and Plan: 1. Shortness of breath - given age and recent oral contraceptive use, will need to r/o PE - Currently unable to schedule due to time of day, will schedule first thing in the am - Patient instructed to go to ER if symptoms worsen - CT Angio Chest W/Cm &/Or Wo Cm; Future  2. Precordial pain - CT Angio Chest W/Cm &/Or Wo Cm; Future   Olean Ree, FNP-BC  [facility] at [facility], [facility]  05/20/2018 4:34 PM   Follow Up Instructions:    I discussed the assessment and treatment plan with the patient. The patient was provided an opportunity to ask questions and all were answered. The patient agreed with the plan and demonstrated an understanding of the instructions.   The patient was advised to call back or seek an in-person evaluation if the symptoms worsen  or if the condition fails to improve as anticipated.   Emi Belfasteborah B Norlene Lanes, FNP

## 2018-05-21 ENCOUNTER — Other Ambulatory Visit: Payer: Self-pay

## 2018-05-21 ENCOUNTER — Ambulatory Visit (INDEPENDENT_AMBULATORY_CARE_PROVIDER_SITE_OTHER): Payer: BLUE CROSS/BLUE SHIELD

## 2018-05-21 DIAGNOSIS — J439 Emphysema, unspecified: Secondary | ICD-10-CM

## 2018-05-21 DIAGNOSIS — R0602 Shortness of breath: Secondary | ICD-10-CM

## 2018-05-21 DIAGNOSIS — R072 Precordial pain: Secondary | ICD-10-CM

## 2018-05-21 MED ORDER — IOPAMIDOL (ISOVUE-370) INJECTION 76%
100.0000 mL | Freq: Once | INTRAVENOUS | Status: AC | PRN
  Administered 2018-05-21: 100 mL via INTRAVENOUS

## 2018-05-26 ENCOUNTER — Encounter: Payer: Self-pay | Admitting: Family Medicine

## 2018-07-07 ENCOUNTER — Other Ambulatory Visit: Payer: Self-pay | Admitting: Family Medicine

## 2018-07-07 ENCOUNTER — Encounter: Payer: Self-pay | Admitting: Family Medicine

## 2018-07-07 DIAGNOSIS — Z87898 Personal history of other specified conditions: Secondary | ICD-10-CM

## 2018-07-07 MED ORDER — SCOPOLAMINE 1 MG/3DAYS TD PT72: 1.0000 | MEDICATED_PATCH | TRANSDERMAL | 1 refills | Status: AC

## 2018-07-07 NOTE — Telephone Encounter (Signed)
Christie at [facility] left v/m wanting to know if pt only need 1 transderm scop patch or 1 box of patches. Per this pt message I advised at [facility] that pt only requested 1 patch. Lorene Dy voiced understanding and nothing further needed.

## 2018-08-20 ENCOUNTER — Encounter: Payer: Self-pay | Admitting: Family Medicine

## 2020-08-01 IMAGING — CT CT ANGIOGRAPHY CHEST
2 of 9 series · 19 of 36 positions shown · IV contrast (iopamidol)
Comparison: Prior chest radiographs, most recent 08/29/2016.

CLINICAL DATA: Short of breath for 1 week.

EXAM:
CT ANGIOGRAPHY CHEST WITH CONTRAST
TECHNIQUE: Multidetector CT imaging of the chest was performed using the
standard protocol during bolus administration of intravenous
contrast. Multiplanar CT image reconstructions and MIPs were
obtained to evaluate the vascular anatomy.
CONTRAST:  100mL VHF11D-I7V IOPAMIDOL (VHF11D-I7V) INJECTION 76%

[Series 7: pe coronal mpr · coronal · 0.54mm/px · 1 of 122 slices shown]
[im 61/122  mediastinal]
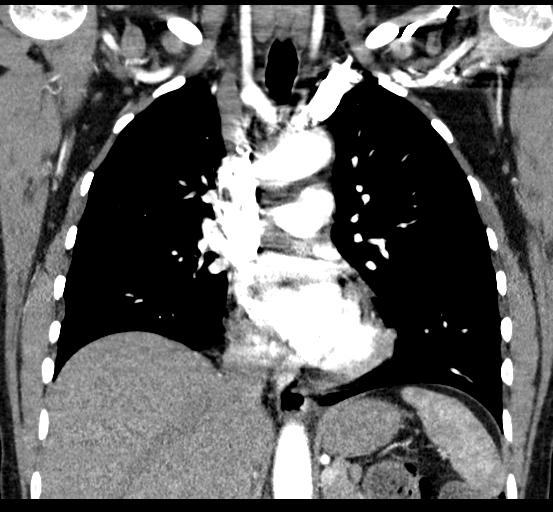

[Series 11: pe thins · axial · 0.62mm/px · z∈[-254,-15]mm · 18 of 269 slices shown]
[im 15/269  lung]
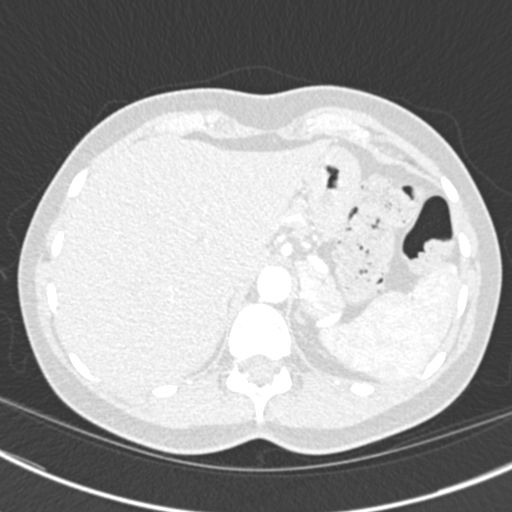
[im 29/269  mediastinal]
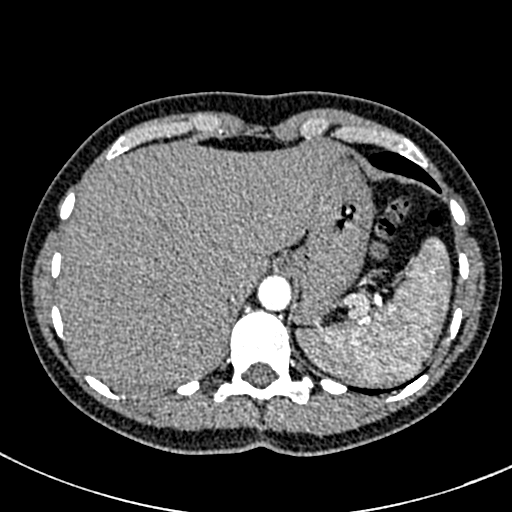
[im 43/269  lung]
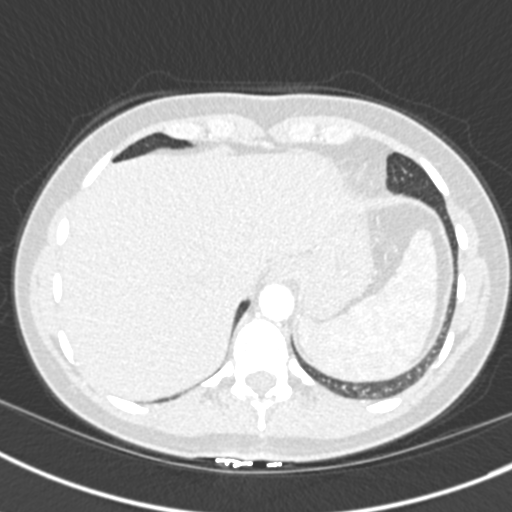
[im 57/269  mediastinal]
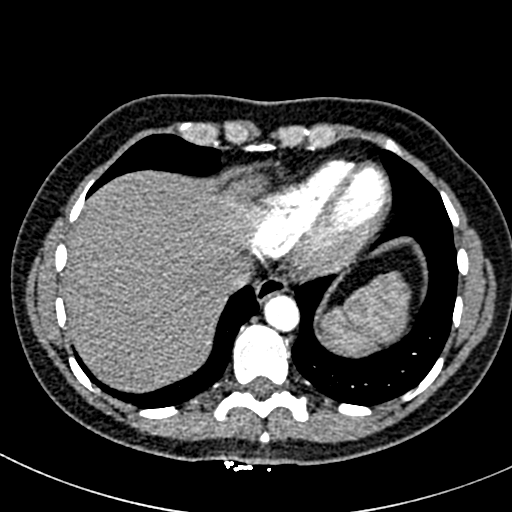
[im 71/269  lung]
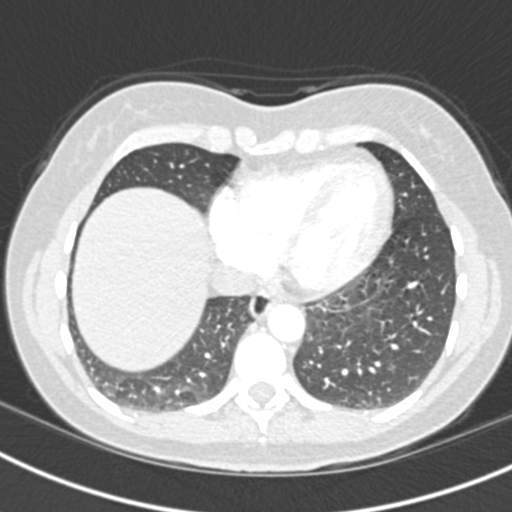
[im 85/269  mediastinal]
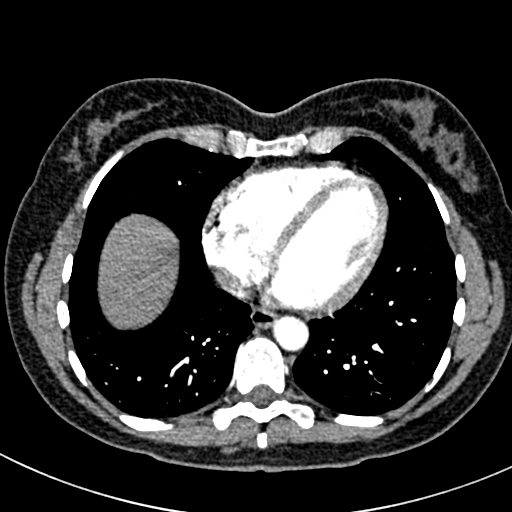
[im 99/269  lung]
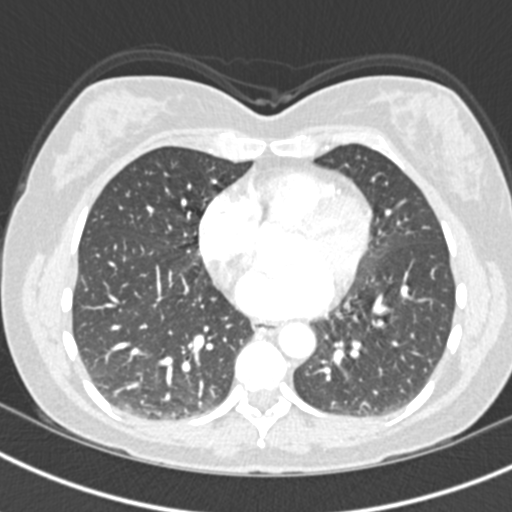
[im 113/269  mediastinal]
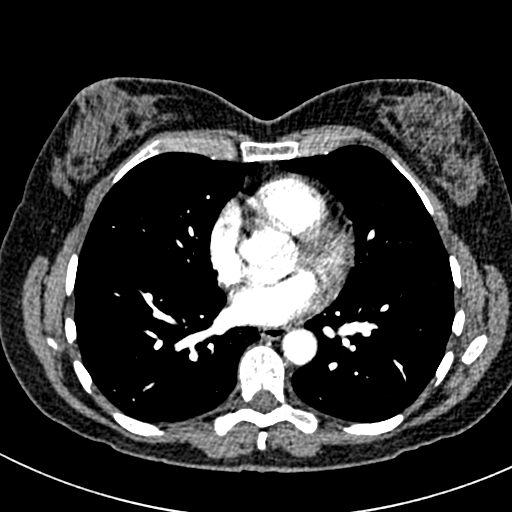
[im 127/269  lung]
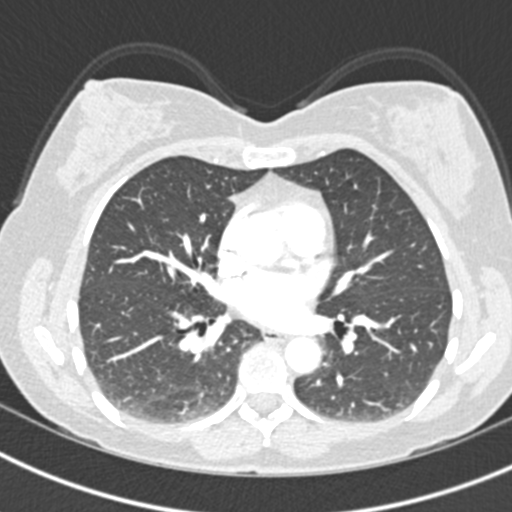
[im 142/269  mediastinal]
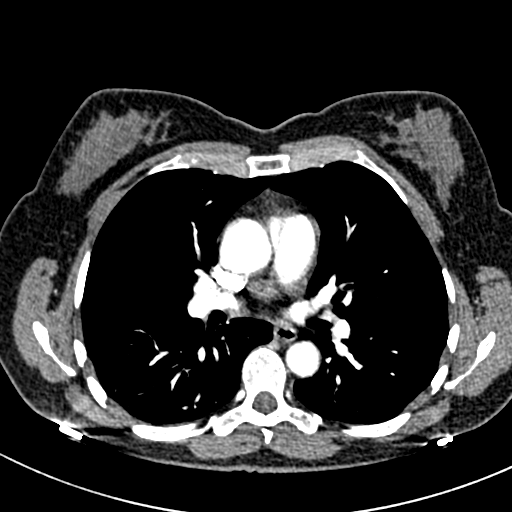
[im 156/269  lung]
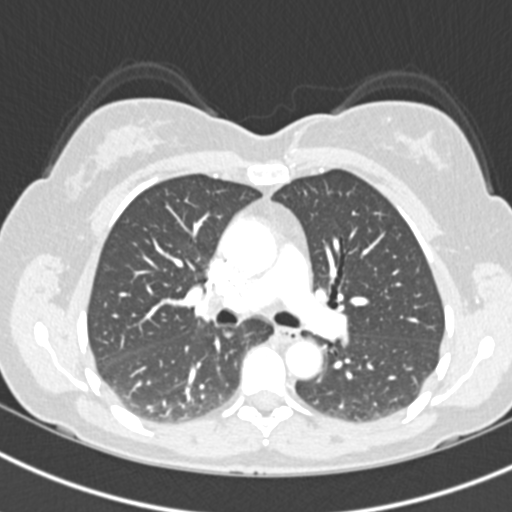
[im 170/269  mediastinal]
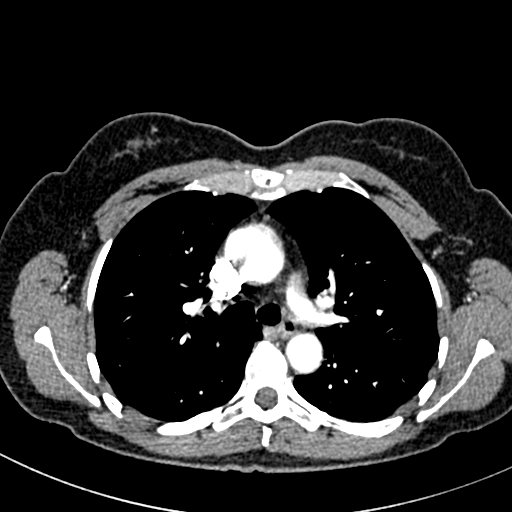
[im 184/269  lung]
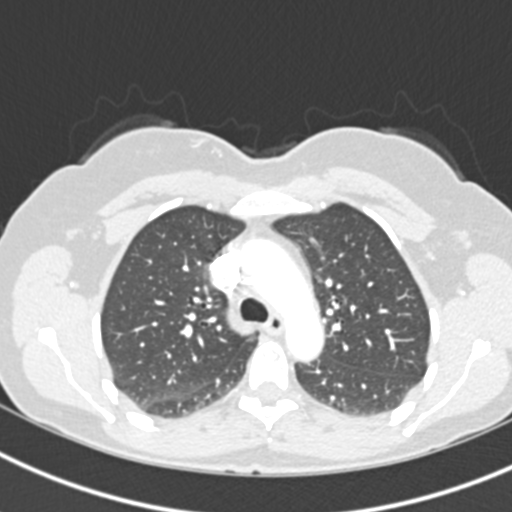
[im 198/269  mediastinal]
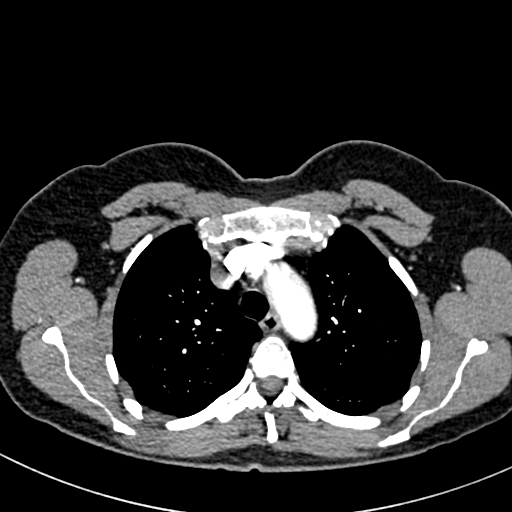
[im 212/269  lung]
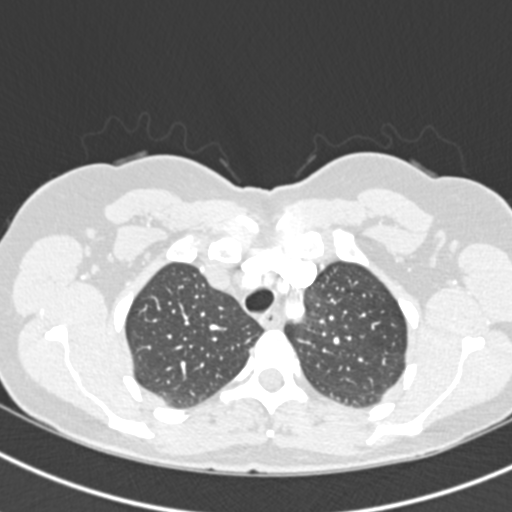
[im 226/269  mediastinal]
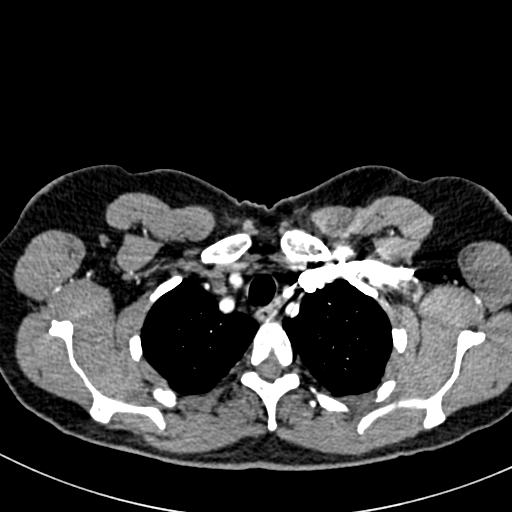
[im 240/269  lung]
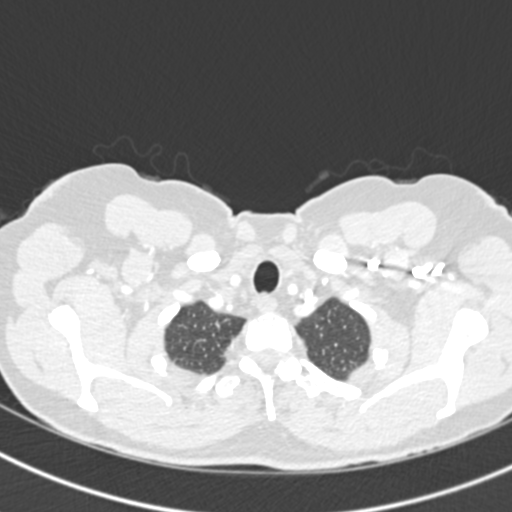
[im 254/269  mediastinal]
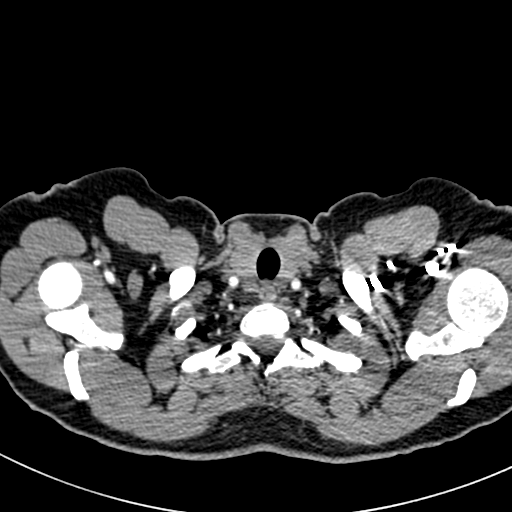

[19 of 36 positions shown; findings below may reference images not displayed]

FINDINGS: Cardiovascular: Satisfactory opacification of the pulmonary arteries
to the segmental level. No evidence of pulmonary embolism. Normal
heart size. No pericardial effusion. No coronary artery
calcifications. Great vessels are normal in caliber. No aortic
atherosclerosis. No dissection.

Mediastinum/Nodes: No enlarged mediastinal, hilar, or axillary lymph
nodes. Thyroid gland, trachea, and esophagus demonstrate no
significant findings.

Lungs/Pleura: Mild centrilobular emphysema, most evident in the left
upper lobe. Lungs otherwise clear. No pleural effusion or
pneumothorax.

Upper Abdomen: Unremarkable.

Musculoskeletal: No chest wall abnormality. No acute or significant
osseous findings.

Review of the MIP images confirms the above findings.
IMPRESSION: 1. No evidence of a pulmonary embolism.
2. No acute findings.
3. Mild emphysema.  No other abnormality.

Emphysema ([id number]).

## 2023-02-27 ENCOUNTER — Ambulatory Visit (INDEPENDENT_AMBULATORY_CARE_PROVIDER_SITE_OTHER): Payer: BLUE CROSS/BLUE SHIELD

## 2023-02-27 ENCOUNTER — Ambulatory Visit
Admission: EM | Admit: 2023-02-27 | Discharge: 2023-02-27 | Disposition: A | Discharge status: Home / Self Care | Payer: BC Managed Care – PPO

## 2023-02-27 ENCOUNTER — Other Ambulatory Visit: Payer: Self-pay

## 2023-02-27 DIAGNOSIS — R42 Dizziness and giddiness: Secondary | ICD-10-CM | POA: Diagnosis not present

## 2023-02-27 DIAGNOSIS — R0602 Shortness of breath: Secondary | ICD-10-CM

## 2023-02-27 DIAGNOSIS — R000 Tachycardia, unspecified: Secondary | ICD-10-CM | POA: Diagnosis not present

## 2023-02-27 NOTE — ED Provider Notes (Signed)
[facility]    CSN: [id number] Arrival date & time: 02/27/23  1227      History   Chief Complaint Chief Complaint  Patient presents with   Dizziness   Shortness of Breath   Tachycardia    HPI Kimberly Mooney is a 53 y.o. female.   HPI 53 year old female presents with dizziness, shortness of breath and tachycardia for 1 week.  Patient reports Apple Watch shows a lower saturation level.  History reviewed. No pertinent past medical history.  There are no active problems to display for this patient.   Past Surgical History:  Procedure Laterality Date   CHOLECYSTECTOMY      OB History   No obstetric history on file.      Home Medications    Prior to Admission medications   Medication Sig Start Date End Date Taking? Authorizing Provider  traZODone (DESYREL) 100 MG tablet Take by mouth. 04/29/22 04/30/23 Yes [provider]  escitalopram (LEXAPRO) 20 MG tablet Take 20 mg by mouth daily.    [provider]  levothyroxine (SYNTHROID) 150 MCG tablet     [provider]    Family History Family History  Problem Relation Age of Onset   Hypertension Mother    Cancer Mother    Cancer Father     Social History Social History   Tobacco Use   Smoking status: Never   Smokeless tobacco: Never  Substance Use Topics   Alcohol use: Yes    Comment: occ     Allergies   Penicillins   Review of Systems Review of Systems  Respiratory:  Positive for shortness of breath.   Cardiovascular:        Tachycardia per patient  Neurological:  Positive for dizziness.  All other systems reviewed and are negative.    Physical Exam Triage Vital Signs ED Triage Vitals  Encounter Vitals Group     BP 02/27/23 1256 (!) 158/92     Systolic BP Percentile --      Diastolic BP Percentile --      Pulse Rate 02/27/23 1256 73     Resp 02/27/23 1256 19     Temp 02/27/23 1256 97.8 F (36.6 C)     Temp src --      SpO2 02/27/23 1256 98 %      Weight --      Height --      Head Circumference --      Peak Flow --      Pain Score 02/27/23 1253 0     Pain Loc --      Pain Education --      Exclude from Growth Chart --    No data found.  Updated Vital Signs BP 118/82 (BP Location: Right Arm)   Pulse 73   Temp 97.8 F (36.6 C)   Resp 19   SpO2 98%    Physical Exam Vitals and nursing note reviewed.  Constitutional:      Appearance: Normal appearance.  HENT:     Head: Normocephalic and atraumatic.     Right Ear: Tympanic membrane, ear canal and external ear normal.     Left Ear: Tympanic membrane, ear canal and external ear normal.     Mouth/Throat:     Mouth: Mucous membranes are moist.     Pharynx: Oropharynx is clear.  Eyes:     Extraocular Movements: Extraocular movements intact.     Conjunctiva/sclera: Conjunctivae normal.     Pupils:  Pupils are equal, round, and reactive to light.  Cardiovascular:     Rate and Rhythm: Normal rate and regular rhythm.     Pulses: Normal pulses.     Heart sounds: Normal heart sounds. No murmur heard.    No friction rub. No gallop.  Pulmonary:     Effort: Pulmonary effort is normal.     Breath sounds: Normal breath sounds. No wheezing, rhonchi or rales.  Musculoskeletal:        General: Normal range of motion.     Cervical back: Normal range of motion and neck supple.  Skin:    General: Skin is warm and dry.  Neurological:     General: No focal deficit present.     Mental Status: She is alert and oriented to person, place, and time. Mental status is at baseline.  Psychiatric:        Mood and Affect: Mood normal.        Behavior: Behavior normal.      UC Treatments / Results  Labs (all labs ordered are listed, but only abnormal results are displayed) Labs Reviewed - No data to display  EKG   Radiology DG Chest 2 View Result Date: 02/27/2023 CLINICAL DATA:  Shortness of breath and tachycardia for 1 week. EXAM: CHEST - 2 VIEW COMPARISON:  None Available.  FINDINGS: Heart size and mediastinal contours are normal. There is no pleural fluid, interstitial edema or airspace disease. The visualized osseous structures are unremarkable. IMPRESSION: No active cardiopulmonary disease. Electronically Signed   By: Signa Kell M.D.   On: 02/27/2023 14:19    Procedures Procedures (including critical care time)  Medications Ordered in UC Medications - No data to display  Initial Impression / Assessment and Plan / UC Course  I have reviewed the triage vital signs and the nursing notes.  Pertinent labs & imaging results that were available during my care of the patient were reviewed by me and considered in my medical decision making (see chart for details).     MDM: 1.  Dizziness-EKG revealed normal sinus rhythm; 2.  Tachycardia-EKG revealed normal sinus rhythm; 3.  Shortness of breath-CXR results revealed above. Advised patient of EKG results and chest x-ray with hardcopy and images provided.  Advised patient if symptoms worsen and/or unresolved please follow-up with PCP or go to nearest ED for further evaluation.  Patient discharged home, hemodynamically stable. Final Clinical Impressions(s) / UC Diagnoses   Final diagnoses:  Dizziness  Shortness of breath  Tachycardia     Discharge Instructions      Advised patient of EKG results and chest x-ray with hardcopy and images provided.  Advised patient if symptoms worsen and/or unresolved please follow-up with PCP or go to nearest ED for further evaluation.     ED Prescriptions   None    PDMP not reviewed this encounter.   Trevor Iha, FNP 02/27/23 1433

## 2023-02-27 NOTE — Discharge Instructions (Addendum)
Advised patient of EKG results and chest x-ray with hardcopy and images provided.  Advised patient if symptoms worsen and/or unresolved please follow-up with PCP or go to nearest ED for further evaluation.

## 2023-02-27 NOTE — ED Triage Notes (Signed)
Pt presents to uc with co of tachycardia with exertion for one week with sob starting last night and dizziness. Apple watch reported desaturation into the low 90s.
